# Patient Record
Sex: Male | Born: 1945 | Race: White | Hispanic: No | Marital: Married | Smoking: Former smoker
Health system: Southern US, Community
[De-identification: ages and names within clinical notes are randomized; demographics above are authoritative.]

## PROBLEM LIST (undated history)

## (undated) DIAGNOSIS — I440 Atrioventricular block, first degree: Secondary | ICD-10-CM

## (undated) DIAGNOSIS — L565 Disseminated superficial actinic porokeratosis (DSAP): Secondary | ICD-10-CM

## (undated) DIAGNOSIS — D696 Thrombocytopenia, unspecified: Secondary | ICD-10-CM

## (undated) DIAGNOSIS — R413 Other amnesia: Secondary | ICD-10-CM

## (undated) DIAGNOSIS — I4891 Unspecified atrial fibrillation: Secondary | ICD-10-CM

## (undated) DIAGNOSIS — E785 Hyperlipidemia, unspecified: Secondary | ICD-10-CM

## (undated) DIAGNOSIS — M1990 Unspecified osteoarthritis, unspecified site: Secondary | ICD-10-CM

## (undated) DIAGNOSIS — I499 Cardiac arrhythmia, unspecified: Secondary | ICD-10-CM

## (undated) DIAGNOSIS — N400 Enlarged prostate without lower urinary tract symptoms: Secondary | ICD-10-CM

## (undated) DIAGNOSIS — I10 Essential (primary) hypertension: Secondary | ICD-10-CM

## (undated) DIAGNOSIS — F0390 Unspecified dementia without behavioral disturbance: Secondary | ICD-10-CM

## (undated) DIAGNOSIS — H9190 Unspecified hearing loss, unspecified ear: Secondary | ICD-10-CM

## (undated) DIAGNOSIS — F339 Major depressive disorder, recurrent, unspecified: Secondary | ICD-10-CM

## (undated) DIAGNOSIS — K219 Gastro-esophageal reflux disease without esophagitis: Secondary | ICD-10-CM

## (undated) HISTORY — DX: Other amnesia: R41.3

## (undated) HISTORY — DX: Unspecified dementia, unspecified severity, without behavioral disturbance, psychotic disturbance, mood disturbance, and anxiety: F03.90

## (undated) HISTORY — DX: Gastro-esophageal reflux disease without esophagitis: K21.9

## (undated) HISTORY — DX: Hyperlipidemia, unspecified: E78.5

## (undated) HISTORY — DX: Disseminated superficial actinic porokeratosis (DSAP): L56.5

## (undated) HISTORY — DX: Thrombocytopenia, unspecified: D69.6

## (undated) HISTORY — DX: Unspecified atrial fibrillation: I48.91

## (undated) HISTORY — DX: Atrioventricular block, first degree: I44.0

## (undated) HISTORY — DX: Major depressive disorder, recurrent, unspecified: F33.9

## (undated) HISTORY — PX: CATARACT EXTRACTION, BILATERAL: SHX1313

## (undated) HISTORY — PX: HERNIA REPAIR: SHX51

## (undated) HISTORY — DX: Unspecified osteoarthritis, unspecified site: M19.90

## (undated) HISTORY — PX: OTHER SURGICAL HISTORY: SHX169

---
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
## 1995-01-06 HISTORY — PX: CARDIAC CATHETERIZATION: SHX172

## 1997-10-05 ENCOUNTER — Other Ambulatory Visit: Admission: RE | Admit: 1997-10-05 | Discharge: 1997-10-05 | Payer: Self-pay | Admitting: Urology

## 1999-06-19 ENCOUNTER — Other Ambulatory Visit: Admission: RE | Admit: 1999-06-19 | Discharge: 1999-06-19 | Payer: Self-pay | Admitting: Urology

## 2000-01-16 ENCOUNTER — Other Ambulatory Visit: Admission: RE | Admit: 2000-01-16 | Discharge: 2000-01-16 | Payer: Self-pay | Admitting: Urology

## 2001-04-29 ENCOUNTER — Ambulatory Visit (HOSPITAL_BASED_OUTPATIENT_CLINIC_OR_DEPARTMENT_OTHER): Admission: RE | Admit: 2001-04-29 | Discharge: 2001-04-29 | Payer: Self-pay | Admitting: Urology

## 2011-01-30 DIAGNOSIS — E785 Hyperlipidemia, unspecified: Secondary | ICD-10-CM | POA: Diagnosis not present

## 2011-01-30 DIAGNOSIS — I10 Essential (primary) hypertension: Secondary | ICD-10-CM | POA: Diagnosis not present

## 2011-01-30 DIAGNOSIS — I4891 Unspecified atrial fibrillation: Secondary | ICD-10-CM | POA: Diagnosis not present

## 2011-02-19 DIAGNOSIS — R040 Epistaxis: Secondary | ICD-10-CM | POA: Diagnosis not present

## 2011-02-19 DIAGNOSIS — J3489 Other specified disorders of nose and nasal sinuses: Secondary | ICD-10-CM | POA: Diagnosis not present

## 2011-02-19 DIAGNOSIS — I4892 Unspecified atrial flutter: Secondary | ICD-10-CM | POA: Diagnosis not present

## 2011-06-23 DIAGNOSIS — H2510 Age-related nuclear cataract, unspecified eye: Secondary | ICD-10-CM | POA: Diagnosis not present

## 2011-06-23 DIAGNOSIS — R972 Elevated prostate specific antigen [PSA]: Secondary | ICD-10-CM | POA: Diagnosis not present

## 2011-06-23 DIAGNOSIS — N138 Other obstructive and reflux uropathy: Secondary | ICD-10-CM | POA: Diagnosis not present

## 2011-06-23 DIAGNOSIS — N401 Enlarged prostate with lower urinary tract symptoms: Secondary | ICD-10-CM | POA: Diagnosis not present

## 2011-06-26 DIAGNOSIS — I4891 Unspecified atrial fibrillation: Secondary | ICD-10-CM | POA: Diagnosis not present

## 2011-06-26 DIAGNOSIS — Z6829 Body mass index (BMI) 29.0-29.9, adult: Secondary | ICD-10-CM | POA: Diagnosis not present

## 2011-06-26 DIAGNOSIS — I10 Essential (primary) hypertension: Secondary | ICD-10-CM | POA: Diagnosis not present

## 2011-06-26 DIAGNOSIS — E785 Hyperlipidemia, unspecified: Secondary | ICD-10-CM | POA: Diagnosis not present

## 2011-07-03 DIAGNOSIS — I10 Essential (primary) hypertension: Secondary | ICD-10-CM | POA: Diagnosis not present

## 2011-07-03 DIAGNOSIS — E785 Hyperlipidemia, unspecified: Secondary | ICD-10-CM | POA: Diagnosis not present

## 2011-07-24 DIAGNOSIS — B079 Viral wart, unspecified: Secondary | ICD-10-CM | POA: Diagnosis not present

## 2011-07-24 DIAGNOSIS — L578 Other skin changes due to chronic exposure to nonionizing radiation: Secondary | ICD-10-CM | POA: Diagnosis not present

## 2011-07-24 DIAGNOSIS — L570 Actinic keratosis: Secondary | ICD-10-CM | POA: Diagnosis not present

## 2011-10-09 DIAGNOSIS — I4892 Unspecified atrial flutter: Secondary | ICD-10-CM | POA: Diagnosis not present

## 2011-10-09 DIAGNOSIS — E785 Hyperlipidemia, unspecified: Secondary | ICD-10-CM | POA: Diagnosis not present

## 2011-10-09 DIAGNOSIS — I10 Essential (primary) hypertension: Secondary | ICD-10-CM | POA: Diagnosis not present

## 2011-10-09 DIAGNOSIS — I4891 Unspecified atrial fibrillation: Secondary | ICD-10-CM | POA: Diagnosis not present

## 2011-10-16 DIAGNOSIS — I4891 Unspecified atrial fibrillation: Secondary | ICD-10-CM | POA: Diagnosis not present

## 2011-10-16 DIAGNOSIS — Z23 Encounter for immunization: Secondary | ICD-10-CM | POA: Diagnosis not present

## 2011-10-16 DIAGNOSIS — I10 Essential (primary) hypertension: Secondary | ICD-10-CM | POA: Diagnosis not present

## 2011-12-11 DIAGNOSIS — R972 Elevated prostate specific antigen [PSA]: Secondary | ICD-10-CM | POA: Diagnosis not present

## 2011-12-11 DIAGNOSIS — Z8042 Family history of malignant neoplasm of prostate: Secondary | ICD-10-CM | POA: Diagnosis not present

## 2011-12-18 DIAGNOSIS — N401 Enlarged prostate with lower urinary tract symptoms: Secondary | ICD-10-CM | POA: Diagnosis not present

## 2011-12-18 DIAGNOSIS — Z8042 Family history of malignant neoplasm of prostate: Secondary | ICD-10-CM | POA: Diagnosis not present

## 2011-12-18 DIAGNOSIS — R972 Elevated prostate specific antigen [PSA]: Secondary | ICD-10-CM | POA: Diagnosis not present

## 2012-01-22 DIAGNOSIS — R972 Elevated prostate specific antigen [PSA]: Secondary | ICD-10-CM | POA: Diagnosis not present

## 2012-01-22 DIAGNOSIS — N401 Enlarged prostate with lower urinary tract symptoms: Secondary | ICD-10-CM | POA: Diagnosis not present

## 2012-01-25 ENCOUNTER — Other Ambulatory Visit: Payer: Self-pay | Admitting: Urology

## 2012-01-29 DIAGNOSIS — L570 Actinic keratosis: Secondary | ICD-10-CM | POA: Diagnosis not present

## 2012-02-01 ENCOUNTER — Other Ambulatory Visit: Payer: Self-pay | Admitting: Urology

## 2012-02-17 ENCOUNTER — Encounter (HOSPITAL_COMMUNITY): Payer: Self-pay | Admitting: Pharmacy Technician

## 2012-02-19 ENCOUNTER — Encounter (HOSPITAL_COMMUNITY)
Admission: RE | Admit: 2012-02-19 | Discharge: 2012-02-19 | Disposition: A | Discharge status: Home / Self Care | Payer: Medicare Other | Source: Ambulatory Visit | Attending: Urology | Admitting: Urology

## 2012-02-19 ENCOUNTER — Ambulatory Visit (HOSPITAL_COMMUNITY)
Admission: RE | Admit: 2012-02-19 | Discharge: 2012-02-19 | Disposition: A | Discharge status: Home / Self Care | Payer: Medicare Other | Source: Ambulatory Visit | Attending: Urology | Admitting: Urology

## 2012-02-19 ENCOUNTER — Inpatient Hospital Stay (HOSPITAL_COMMUNITY): Admission: RE | Admit: 2012-02-19 | Payer: Self-pay | Source: Ambulatory Visit

## 2012-02-19 ENCOUNTER — Encounter (HOSPITAL_COMMUNITY): Payer: Self-pay

## 2012-02-19 DIAGNOSIS — I10 Essential (primary) hypertension: Secondary | ICD-10-CM | POA: Diagnosis not present

## 2012-02-19 DIAGNOSIS — Z01818 Encounter for other preprocedural examination: Secondary | ICD-10-CM | POA: Diagnosis not present

## 2012-02-19 DIAGNOSIS — Z01810 Encounter for preprocedural cardiovascular examination: Secondary | ICD-10-CM | POA: Diagnosis not present

## 2012-02-19 DIAGNOSIS — N400 Enlarged prostate without lower urinary tract symptoms: Secondary | ICD-10-CM | POA: Insufficient documentation

## 2012-02-19 DIAGNOSIS — Z01812 Encounter for preprocedural laboratory examination: Secondary | ICD-10-CM | POA: Insufficient documentation

## 2012-02-19 DIAGNOSIS — I2510 Atherosclerotic heart disease of native coronary artery without angina pectoris: Secondary | ICD-10-CM | POA: Insufficient documentation

## 2012-02-19 HISTORY — DX: Essential (primary) hypertension: I10

## 2012-02-19 HISTORY — DX: Unspecified hearing loss, unspecified ear: H91.90

## 2012-02-19 HISTORY — DX: Cardiac arrhythmia, unspecified: I49.9

## 2012-02-19 HISTORY — DX: Unspecified osteoarthritis, unspecified site: M19.90

## 2012-02-19 HISTORY — DX: Benign prostatic hyperplasia without lower urinary tract symptoms: N40.0

## 2012-02-19 HISTORY — DX: Disseminated superficial actinic porokeratosis (DSAP): L56.5

## 2012-02-19 LAB — BASIC METABOLIC PANEL
Calcium: 8.9 mg/dL (ref 8.4–10.5)
Chloride: 104 mEq/L (ref 96–112)
Creatinine, Ser: 1.13 mg/dL (ref 0.50–1.35)
GFR calc Af Amer: 76 mL/min — ABNORMAL LOW (ref >90)
Sodium: 139 mEq/L (ref 135–145)

## 2012-02-19 LAB — CBC
MCH: 34.2 pg — ABNORMAL HIGH (ref 26.0–34.0)
MCV: 99.6 fL (ref 78.0–100.0)
Platelets: 149 10*3/uL — ABNORMAL LOW (ref 150–400)
RDW: 12.3 % (ref 11.5–15.5)
WBC: 6.4 10*3/uL (ref 4.0–10.5)

## 2012-02-19 LAB — PROTIME-INR
INR: 1.15 (ref 0.00–1.49)
Prothrombin Time: 14.5 seconds (ref 11.6–15.2)

## 2012-02-19 LAB — SURGICAL PCR SCREEN: MRSA, PCR: NEGATIVE

## 2012-02-19 NOTE — Pre-Procedure Instructions (Signed)
PREOP CBC, BMET, PT, PTT, EKG, CXR WERE DONE TODAY AT [facility] AS PER ANESTHESIOLOGIST'S GUIDELINES. PT'S MOST RECENT CARDIOLOGY OFFICE NOTES HAVE BEEN REQUESTED FROM DR. ZAN TYSON'S OFFICE.

## 2012-02-19 NOTE — Patient Instructions (Signed)
YOUR SURGERY IS SCHEDULED AT [facility]  ON:  Friday  2/21  REPORT TO [facility]AY [facility] AT:  8:00 AM      PHONE # FOR SHORT STAY IS [phone]  DO NOT EAT OR DRINK ANYTHING AFTER MIDNIGHT THE NIGHT BEFORE YOUR SURGERY.  YOU MAY BRUSH YOUR TEETH, RINSE OUT YOUR MOUTH--BUT NO WATER, NO FOOD, NO CHEWING GUM, NO MINTS, NO CANDIES, NO CHEWING TOBACCO.  PLEASE TAKE THE FOLLOWING MEDICATIONS THE AM OF YOUR SURGERY WITH A FEW SIPS OF WATER:  BISOPROLOL AND FLECANIDE    DO NOT BRING VALUABLES, MONEY, CREDIT CARDS.  DO NOT WEAR JEWELRY, MAKE-UP, NAIL POLISH AND NO METAL PINS OR CLIPS IN YOUR HAIR. CONTACT LENS, DENTURES / PARTIALS, GLASSES SHOULD NOT BE WORN TO SURGERY AND IN MOST CASES-HEARING AIDS WILL NEED TO BE REMOVED.  BRING YOUR GLASSES CASE, ANY EQUIPMENT NEEDED FOR YOUR CONTACT LENS. FOR PATIENTS ADMITTED TO THE HOSPITAL--CHECK OUT TIME THE DAY OF DISCHARGE IS 11:00 AM.  ALL INPATIENT ROOMS ARE PRIVATE - WITH BATHROOM, TELEPHONE, TELEVISION AND WIFI INTERNET.  IF YOU ARE BEING DISCHARGED THE SAME DAY OF YOUR SURGERY--YOU CAN NOT DRIVE YOURSELF HOME--AND SHOULD NOT GO HOME ALONE BY TAXI OR BUS.  NO DRIVING OR OPERATING MACHINERY FOR 24 HOURS FOLLOWING ANESTHESIA / PAIN MEDICATIONS.  PLEASE MAKE ARRANGEMENTS FOR SOMEONE TO BE WITH YOU AT HOME THE FIRST 24 HOURS AFTER SURGERY. RESPONSIBLE DRIVER'S NAME  [facility]                                               PHONE #   [phone]                              PLEASE READ OVER ANY  FACT SHEETS THAT YOU WERE GIVEN: MRSA INFORMATION, BLOOD TRANSFUSION INFORMATION, INCENTIVE SPIROMETER INFORMATION. FAILURE TO FOLLOW THESE INSTRUCTIONS MAY RESULT IN THE CANCELLATION OF YOUR SURGERY.   PATIENT SIGNATURE_________________________________

## 2012-02-22 ENCOUNTER — Encounter (HOSPITAL_COMMUNITY): Payer: Self-pay

## 2012-02-22 NOTE — Pre-Procedure Instructions (Signed)
PT'S MOST RECENT CARDIOLOGY OFFICE NOTE 10/09/11 AND EKG 01/30/11 FROM [facility] - DR. Chales Abrahams - ON THIS CHART.

## 2012-02-22 NOTE — Pre-Procedure Instructions (Signed)
PT'S PT, INR WITHIN NORMAL LIMITS, PTT ELEVATED.  FAXED PT, INR, PTT REPORTS TO DR. TANNENBAUM WITH NOTE TO LET ME KNOW IF HE WANTS PTT REPEATED DAY OF SURGERY.

## 2012-02-26 ENCOUNTER — Encounter (HOSPITAL_COMMUNITY): Payer: Self-pay | Admitting: Anesthesiology

## 2012-02-26 ENCOUNTER — Ambulatory Visit (HOSPITAL_COMMUNITY): Payer: Medicare Other | Admitting: Anesthesiology

## 2012-02-26 ENCOUNTER — Encounter (HOSPITAL_COMMUNITY): Payer: Self-pay | Admitting: *Deleted

## 2012-02-26 ENCOUNTER — Encounter (HOSPITAL_COMMUNITY)
Admission: RE | Disposition: A | Discharge status: Home / Self Care | Payer: Self-pay | Source: Ambulatory Visit | Attending: Urology

## 2012-02-26 ENCOUNTER — Ambulatory Visit (HOSPITAL_COMMUNITY)
Admission: RE | Admit: 2012-02-26 | Discharge: 2012-02-26 | Disposition: A | Discharge status: Home / Self Care | Payer: Medicare Other | Source: Ambulatory Visit | Attending: Urology | Admitting: Urology

## 2012-02-26 DIAGNOSIS — N138 Other obstructive and reflux uropathy: Secondary | ICD-10-CM | POA: Insufficient documentation

## 2012-02-26 DIAGNOSIS — R972 Elevated prostate specific antigen [PSA]: Secondary | ICD-10-CM | POA: Insufficient documentation

## 2012-02-26 DIAGNOSIS — Z802 Family history of malignant neoplasm of other respiratory and intrathoracic organs: Secondary | ICD-10-CM | POA: Diagnosis not present

## 2012-02-26 DIAGNOSIS — Z79899 Other long term (current) drug therapy: Secondary | ICD-10-CM | POA: Insufficient documentation

## 2012-02-26 DIAGNOSIS — N401 Enlarged prostate with lower urinary tract symptoms: Secondary | ICD-10-CM | POA: Insufficient documentation

## 2012-02-26 DIAGNOSIS — I10 Essential (primary) hypertension: Secondary | ICD-10-CM | POA: Diagnosis not present

## 2012-02-26 DIAGNOSIS — N400 Enlarged prostate without lower urinary tract symptoms: Secondary | ICD-10-CM

## 2012-02-26 DIAGNOSIS — I499 Cardiac arrhythmia, unspecified: Secondary | ICD-10-CM | POA: Diagnosis not present

## 2012-02-26 HISTORY — PX: GREEN LIGHT LASER TURP (TRANSURETHRAL RESECTION OF PROSTATE: SHX6260

## 2012-02-26 SURGERY — GREEN LIGHT LASER TURP (TRANSURETHRAL RESECTION OF PROSTATE
Anesthesia: General | Site: Prostate | Wound class: Clean Contaminated

## 2012-02-26 MED ORDER — KETOROLAC TROMETHAMINE 30 MG/ML IJ SOLN
INTRAMUSCULAR | Status: AC
  Filled 2012-02-26: qty 1

## 2012-02-26 MED ORDER — LACTATED RINGERS IV SOLN
INTRAVENOUS | Status: DC
  Administered 2012-02-26 (×3): via INTRAVENOUS

## 2012-02-26 MED ORDER — CEFAZOLIN SODIUM-DEXTROSE 2-3 GM-% IV SOLR
INTRAVENOUS | Status: AC
  Filled 2012-02-26: qty 50

## 2012-02-26 MED ORDER — CHLORHEXIDINE GLUCONATE 4 % EX LIQD
Freq: Once | CUTANEOUS | Status: DC
  Filled 2012-02-26: qty 15

## 2012-02-26 MED ORDER — ACETAMINOPHEN 10 MG/ML IV SOLN
INTRAVENOUS | Status: DC | PRN
  Administered 2012-02-26: 1000 mg via INTRAVENOUS

## 2012-02-26 MED ORDER — FENTANYL CITRATE 0.05 MG/ML IJ SOLN: 25.0000 ug | INTRAMUSCULAR | Status: DC | PRN

## 2012-02-26 MED ORDER — LIDOCAINE HCL 2 % EX GEL
CUTANEOUS | Status: AC
  Filled 2012-02-26: qty 10

## 2012-02-26 MED ORDER — TRAMADOL-ACETAMINOPHEN 37.5-325 MG PO TABS: 1.0000 | ORAL_TABLET | Freq: Four times a day (QID) | ORAL | Status: DC | PRN

## 2012-02-26 MED ORDER — METOCLOPRAMIDE HCL 5 MG/ML IJ SOLN
INTRAMUSCULAR | Status: DC | PRN
  Administered 2012-02-26: 10 mg via INTRAVENOUS

## 2012-02-26 MED ORDER — KETOROLAC TROMETHAMINE 30 MG/ML IJ SOLN
15.0000 mg | Freq: Once | INTRAMUSCULAR | Status: AC | PRN
  Administered 2012-02-26: 30 mg via INTRAVENOUS

## 2012-02-26 MED ORDER — PROMETHAZINE HCL 25 MG/ML IJ SOLN: 6.2500 mg | INTRAMUSCULAR | Status: DC | PRN

## 2012-02-26 MED ORDER — BELLADONNA ALKALOIDS-OPIUM 16.2-60 MG RE SUPP
RECTAL | Status: DC | PRN
  Administered 2012-02-26: 1 via RECTAL

## 2012-02-26 MED ORDER — BELLADONNA ALKALOIDS-OPIUM 16.2-60 MG RE SUPP
RECTAL | Status: AC
  Filled 2012-02-26: qty 1

## 2012-02-26 MED ORDER — CEFAZOLIN SODIUM-DEXTROSE 2-3 GM-% IV SOLR
2.0000 g | INTRAVENOUS | Status: AC
  Administered 2012-02-26: 2 g via INTRAVENOUS

## 2012-02-26 MED ORDER — PROPOFOL 10 MG/ML IV BOLUS
INTRAVENOUS | Status: DC | PRN
  Administered 2012-02-26: 200 mg via INTRAVENOUS

## 2012-02-26 MED ORDER — ACETAMINOPHEN 10 MG/ML IV SOLN
INTRAVENOUS | Status: AC
  Filled 2012-02-26: qty 100

## 2012-02-26 MED ORDER — EPHEDRINE SULFATE 50 MG/ML IJ SOLN
INTRAMUSCULAR | Status: DC | PRN
  Administered 2012-02-26: 10 mg via INTRAVENOUS
  Administered 2012-02-26 (×2): 5 mg via INTRAVENOUS

## 2012-02-26 MED ORDER — URELLE 81 MG PO TABS: 1.0000 | ORAL_TABLET | Freq: Three times a day (TID) | ORAL | Status: DC

## 2012-02-26 MED ORDER — FENTANYL CITRATE 0.05 MG/ML IJ SOLN
INTRAMUSCULAR | Status: DC | PRN
  Administered 2012-02-26: 100 ug via INTRAVENOUS
  Administered 2012-02-26: 50 ug via INTRAVENOUS

## 2012-02-26 MED ORDER — MIDAZOLAM HCL 5 MG/5ML IJ SOLN
INTRAMUSCULAR | Status: DC | PRN
  Administered 2012-02-26: 2 mg via INTRAVENOUS

## 2012-02-26 MED ORDER — SODIUM CHLORIDE 0.9 % IR SOLN
Status: DC | PRN
  Administered 2012-02-26: 9000 mL via INTRAVESICAL

## 2012-02-26 MED ORDER — PHENYLEPHRINE HCL 10 MG/ML IJ SOLN
INTRAMUSCULAR | Status: DC | PRN
  Administered 2012-02-26 (×2): 40 ug via INTRAVENOUS

## 2012-02-26 MED ORDER — TRIMETHOPRIM 100 MG PO TABS: 100.0000 mg | ORAL_TABLET | ORAL | Status: DC

## 2012-02-26 MED ORDER — DEXAMETHASONE SODIUM PHOSPHATE 4 MG/ML IJ SOLN
INTRAMUSCULAR | Status: DC | PRN
  Administered 2012-02-26: 5 mg via INTRAVENOUS

## 2012-02-26 MED ORDER — LIDOCAINE HCL 1 % IJ SOLN
INTRAMUSCULAR | Status: DC | PRN
  Administered 2012-02-26: 50 mg via INTRADERMAL

## 2012-02-26 MED ORDER — ONDANSETRON HCL 4 MG/2ML IJ SOLN
INTRAMUSCULAR | Status: DC | PRN
  Administered 2012-02-26: 4 mg via INTRAVENOUS

## 2012-02-26 SURGICAL SUPPLY — 26 items
BAG URINE DRAINAGE (UROLOGICAL SUPPLIES) ×2 IMPLANT
BAG URO CATCHER STRL LF (DRAPE) ×2 IMPLANT
CATH FOLEY 2WAY SLVR 30CC 22FR (CATHETERS) IMPLANT
CATH HEMA 3WAY 30CC 22FR COUDE (CATHETERS) ×3 IMPLANT
CLOTH BEACON ORANGE TIMEOUT ST (SAFETY) ×2 IMPLANT
DRAPE CAMERA CLOSED 9X96 (DRAPES) ×2 IMPLANT
ELECT BUTTON HF 24-28F 2 30DE (ELECTRODE) IMPLANT
ELECT LOOP MED HF 24F 12D (CUTTING LOOP) IMPLANT
ELECT LOOP MED HF 24F 12D CBL (CLIP) IMPLANT
ELECT RESECT VAPORIZE 12D CBL (ELECTRODE) IMPLANT
FEE RENTAL LASER GREENLIGHT (Laser) ×1 IMPLANT
GLOVE BIOGEL M STRL SZ7.5 (GLOVE) ×2 IMPLANT
GOWN STRL REIN XL XLG (GOWN DISPOSABLE) ×2 IMPLANT
HOLDER FOLEY CATH W/STRAP (MISCELLANEOUS) ×1 IMPLANT
IV NS 1000ML (IV SOLUTION) ×2
IV NS 1000ML BAXH (IV SOLUTION) IMPLANT
IV NS IRRIG 3000ML ARTHROMATIC (IV SOLUTION) ×3 IMPLANT
KIT ASPIRATION TUBING (SET/KITS/TRAYS/PACK) ×2 IMPLANT
LASER FIBER /GREENLIGHT LASER (Laser) ×3 IMPLANT
LASER GREENLIGHT RENTAL P/PROC (Laser) ×4 IMPLANT
MANIFOLD NEPTUNE II (INSTRUMENTS) ×2 IMPLANT
PACK CYSTO (CUSTOM PROCEDURE TRAY) ×2 IMPLANT
PLUG CATH AND CAP STER (CATHETERS) ×2 IMPLANT
SYR 30ML LL (SYRINGE) ×1 IMPLANT
SYRINGE IRR TOOMEY STRL 70CC (SYRINGE) IMPLANT
TUBING CONNECTING 10 (TUBING) ×2 IMPLANT

## 2012-02-26 NOTE — Progress Notes (Signed)
Foley bag changed to leg bag.  Pt and wife instructed on how to empty and position catheter bag as needed for comfort. Pt  Verbalized understanding.

## 2012-02-26 NOTE — H&P (Signed)
Active Problems Problems  1. Benign Prostatic Hypertrophy With Urinary Obstruction 600.01 2. Paternal history of  Prostate Cancer V16.42 3. 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  Hx of BPH with significant BOO & elevated PSA, but with neg biopsies.  He has been treated with Haynes Bast 1/day, but complaining of very poor voiding ability at nighttime.   Nocturia x 4.  Off Coumadin. On ASA 81mg /day.  Note family history of CaP, with RRP, but died 3 years later from Ca Lung. Maternal g-father died 26 2nd CaP. Now taking umeric to try ot lower his PSA. IPSS=13.       Previous Psa 12.50 on 02-22-09. He has been re-biopsied on 5/20/11using new template for recurrent biopsy pts with suspicious psa values (saturation biopsy), and has BPH with some chronic (asymptomatic) inflammation.   12/11/11  PSA - 9.96 06/23/11  PSA - 10.84 12/19/10  PSA - 10.82   Past Medical History Problems  1. History of  Catheter Ablation Of Arrhythmogenic Focus 2. History of  Hypertension 401.9 3. History of  Rhythm Disorder 427.9  Surgical History Problems  1. History of  Cardiovascular Surgery V15.1 2. History of  Inguinal Hernia Repair 3. History of  Umbilical Hernia Repair  Current Meds 1. Bisoprolol Fumarate 5 MG Oral Tablet; Therapy: 17Oct2010 to 2. Cialis 20 MG Oral Tablet; Take as directed; Therapy: 13Jul2009 to (Last Rx:17Oct2011) 3. Flecainide Acetate 100 MG Oral Tablet; Therapy: 29May2012 to 4. Jalyn 0.5-0.4 MG Oral Capsule; TAKE 1 CAPSULE EVERY DAY; Therapy: 27Aug2010 to  (Evaluate:24Dec2012); Last Rx:30Dec2011 5. Lisinopril 10 MG Oral Tablet; Therapy: (Recorded:27Aug2010) to 6. Pradaxa 150 MG Oral Capsule; Therapy: 03Jan2012 to 7. Pravastatin Sodium 40 MG Oral Tablet; Therapy: (Recorded:10Apr2012) to  Requested for:  10Apr2012; Status: ACTIVE - Renewal Denied  Allergies Medication   1. Iodine SOLN  Family History Problems  1. Family history of  Family Health Status Number Of Children 1 son2 daughters 2. Paternal history of  Prostate Cancer V16.42  Social History Problems  1. Alcohol Use 1 a day 2. Caffeine Use 1 3. Family history of  Death In The Family Father 51 4. Family history of  Death In The Family Mother 30 5. Marital History - Currently Married 6. Occupation: Education officer, community 7. History of  Tobacco Use V15.82 1 to 2 packs a day for 4 years  Review of Systems Genitourinary, constitutional, skin, eye, otolaryngeal, hematologic/lymphatic, cardiovascular, pulmonary, endocrine, musculoskeletal, gastrointestinal, neurological and psychiatric system(s) were reviewed and pertinent findings if present are noted.  Genitourinary: urinary frequency, feelings of urinary urgency, nocturia, weak urinary stream, urinary stream starts and stops and incomplete emptying of bladder.    Vitals Vital Signs [Data Includes: Last 1 Day]  17Jan2014 03:07PM  Blood Pressure: 134 / 79 Temperature: 97.5 F Heart Rate: 72  Physical Exam Rectal: Rectal exam demonstrates normal sphincter tone, no tenderness, no masses and no fistula. Estimated prostate size is 3+. Normal rectal tone, no rectal masses, prostate is smooth, symmetric and non-tender. The prostate has no nodularity, is not indurated and is not tender. The left seminal vesicle is nonpalpable. The right seminal vesicle is nonpalpable. The perineum is normal on inspection.  Genitourinary: Examination of the penis demonstrates no discharge, no masses, no lesions and a normal meatus. The scrotum is without lesions. The right epididymis is palpably normal and  non-tender. The left epididymis is palpably normal and non-tender. The right testis is non-tender and without masses. The left testis is non-tender and without masses.    Results/Data  Flow Rate: Voided 328 ml. A peak flow rate of 76ml/s.    Procedure  Procedure:  Cystoscopy   Indication: Lower Urinary Tract Symptoms.  Informed Consent: Risks, benefits, and potential adverse events were discussed and informed consent was obtained from the patient.  Prep: The patient was prepped with betadine.  Anesthesia:. Local anesthesia was administered intraurethrally with 2% lidocaine jelly.  Antibiotic prophylaxis: Ciprofloxacin.  Procedure Note:  Urethral meatus:. No abnormalities.  Anterior urethra: No abnormalities.  Prostatic urethra:. The lateral and median prostatic lobes were enlarged. No intravesical median lobe was visualized.  Bladder: Visulization was clear. The ureteral orifices were in the normal anatomic position bilaterally. Examination of the bladder demonstrated trabeculation, but no clot within the bladder and no diverticulum no fistula, no erythematous mucosa, no ulcer, no edema and no cellules. The patient tolerated the procedure well.  Complications: None.    Assessment Assessed  1. Benign Prostatic Hypertrophy With Urinary Obstruction 600.01 2. Paternal history of  Prostate Cancer V16.42 3. PSA,Elevated 790.93   Chronic bladder outlet obstruction, failing medical therapy. His flow rate is 6 cc per second (normal 25 cc per second). He would like to proceed with green light laser vaporization. Postop foley for 2 days. outpatient procedure.  Schedule green light laser vaporization.   Plan PSA,Elevated (790.93)  1. PVR U/S   Schedule green light laser vaporization.   Signatures Electronically signed by : Jethro Bolus, M.D.; Jan 24 2012  1:52PM

## 2012-02-26 NOTE — Interval H&P Note (Signed)
History and Physical Interval Note:  02/26/2012 11:35 AM  Mitchell Miles  has presented today for surgery, with the diagnosis of Benign Prostatic Hypertrophy  The various methods of treatment have been discussed with the patient and family. After consideration of risks, benefits and other options for treatment, the patient has consented to  Procedure(s): GREEN LIGHT LASER TURP (TRANSURETHRAL RESECTION OF PROSTATE (N/A) as a surgical intervention .  The patient's history has been reviewed, patient examined, no change in status, stable for surgery.  I have reviewed the patient's chart and labs.  Questions were answered to the patient's satisfaction.     Jethro Bolus I

## 2012-02-26 NOTE — Op Note (Signed)
Pre-operative diagnosis :    BPH  Postoperative diagnosis:  Same   Operation:   Cystourethroscopy, greenlight laser vaporization of prostate  Surgeon:  S. Patsi Sears, MD  First assistant:   None  Anesthesia:  general  Preparation:   After appropriate preanesthesia, the patient was brought to the operating room, placed on the operating table in the dorsal supine position where general LMA anesthesia was introduced. He was then replaced in the dorsal lithotomy position with pubis was prepped with Betadine solution and draped in usual fashion. The arm band was double checked.  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 Hx of BPH with significant BOO & elevated PSA, but with neg biopsies. He has been treated with Haynes Bast 1/day, but complaining of very poor voiding ability at nighttime. Nocturia x 4. Off Coumadin. On ASA 81mg /day. Note family history of CaP, with RRP, but died 3 years later from Ca Lung. Maternal g-father died 57 2nd CaP. Now taking umeric to try ot lower his PSA. IPSS=13.  Previous Psa 12.50 on 02-22-09. He has been re-biopsied on 5/20/11using new template for recurrent biopsy pts with suspicious psa values (saturation biopsy), and has BPH with some chronic (asymptomatic) inflammation.      Statement of  Likelihood of Success: Excellent. TIME-OUT observed.:  Procedure:   Cystourethroscopy was accomplished, and showed normal pendulous urethra. The vera was intact during the entire case. The patient had very large lateral lobes, extending across the midline bilaterally. He had an elevated bladder neck. There was no bladder stone tumor or diverticular formation. The trigone was normal, and clear reflux was noted from both orifices.  Laser vaporization was accomplished and from the 7:00 position, to the Vero, and from the 5:00 position to the Vero. The lateral lobes were then vaporized using 80 Y. power at the  bladder neck, and 120 W power on the lateral lobes. Following laser vaporization, the laser was removed. Bleeding was electrocoagulated with the laser room. Following this, a size 22 hematuria catheter was placed with 30 cc in the balloon. The urine was clear route, but the traction device was placed anyway for possible traction at a later time. If necessary a. The patient was given a B. and O. suppository, it was awakened and taken to recovery room in good condition.

## 2012-02-26 NOTE — Anesthesia Preprocedure Evaluation (Signed)
Anesthesia Evaluation  Patient identified by MRN, date of birth, ID band Patient awake    Reviewed: Allergy & Precautions, H&P , NPO status , Patient's Chart, lab work & pertinent test results  Airway Mallampati: II TM Distance: >3 FB Neck ROM: Full    Dental no notable dental hx.    Pulmonary neg pulmonary ROS,  breath sounds clear to auscultation  Pulmonary exam normal       Cardiovascular hypertension, Pt. on medications + dysrhythmias Rhythm:Regular Rate:Normal  H/O afib ablation x 2   Neuro/Psych negative neurological ROS  negative psych ROS   GI/Hepatic negative GI ROS, Neg liver ROS,   Endo/Other  negative endocrine ROS  Renal/GU negative Renal ROS  negative genitourinary   Musculoskeletal negative musculoskeletal ROS (+)   Abdominal   Peds negative pediatric ROS (+)  Hematology negative hematology ROS (+)   Anesthesia Other Findings   Reproductive/Obstetrics negative OB ROS                           Anesthesia Physical Anesthesia Plan  ASA: II  Anesthesia Plan: General   Post-op Pain Management:    Induction: Intravenous  Airway Management Planned: LMA  Additional Equipment:   Intra-op Plan:   Post-operative Plan:   Informed Consent: I have reviewed the patients History and Physical, chart, labs and discussed the procedure including the risks, benefits and alternatives for the proposed anesthesia with the patient or authorized representative who has indicated his/her understanding and acceptance.   Dental advisory given  Plan Discussed with: CRNA and Surgeon  Anesthesia Plan Comments:         Anesthesia Quick Evaluation

## 2012-02-26 NOTE — Transfer of Care (Signed)
Immediate Anesthesia Transfer of Care Note  Patient: Mitchell Miles  Procedure(s) Performed: Procedure(s): GREEN LIGHT LASER TURP (TRANSURETHRAL RESECTION OF PROSTATE (N/A)  Patient Location: PACU  Anesthesia Type:General  Level of Consciousness: awake, alert , oriented and patient cooperative  Airway & Oxygen Therapy: Patient Spontanous Breathing and Patient connected to face mask oxygen  Post-op Assessment: Report given to PACU RN and Post -op Vital signs reviewed and stable  Post vital signs: Reviewed and stable  Complications: No apparent anesthesia complications

## 2012-02-26 NOTE — Anesthesia Postprocedure Evaluation (Signed)
  Anesthesia Post-op Note  Patient: Mitchell Miles  Procedure(s) Performed: Procedure(s) (LRB): GREEN LIGHT LASER TURP (TRANSURETHRAL RESECTION OF PROSTATE (N/A)  Patient Location: PACU  Anesthesia Type: General  Level of Consciousness: awake and alert   Airway and Oxygen Therapy: Patient Spontanous Breathing  Post-op Pain: mild  Post-op Assessment: Post-op Vital signs reviewed, Patient's Cardiovascular Status Stable, Respiratory Function Stable, Patent Airway and No signs of Nausea or vomiting  Last Vitals:  Filed Vitals:   02/26/12 1154  BP: 131/87  Pulse: 67  Temp: 36.5 C  Resp: 16    Post-op Vital Signs: stable   Complications: No apparent anesthesia complications

## 2012-02-29 ENCOUNTER — Encounter (HOSPITAL_COMMUNITY): Payer: Self-pay | Admitting: Urology

## 2012-03-25 DIAGNOSIS — L570 Actinic keratosis: Secondary | ICD-10-CM | POA: Diagnosis not present

## 2012-04-15 DIAGNOSIS — E785 Hyperlipidemia, unspecified: Secondary | ICD-10-CM | POA: Diagnosis not present

## 2012-04-15 DIAGNOSIS — N401 Enlarged prostate with lower urinary tract symptoms: Secondary | ICD-10-CM | POA: Diagnosis not present

## 2012-04-15 DIAGNOSIS — I10 Essential (primary) hypertension: Secondary | ICD-10-CM | POA: Diagnosis not present

## 2012-04-15 DIAGNOSIS — Z0000 Encounter for general adult medical examination without abnormal findings: Secondary | ICD-10-CM | POA: Diagnosis not present

## 2012-04-15 DIAGNOSIS — Z6829 Body mass index (BMI) 29.0-29.9, adult: Secondary | ICD-10-CM | POA: Diagnosis not present

## 2012-05-20 DIAGNOSIS — N401 Enlarged prostate with lower urinary tract symptoms: Secondary | ICD-10-CM | POA: Diagnosis not present

## 2012-05-20 DIAGNOSIS — N3020 Other chronic cystitis without hematuria: Secondary | ICD-10-CM | POA: Diagnosis not present

## 2012-06-17 DIAGNOSIS — H179 Unspecified corneal scar and opacity: Secondary | ICD-10-CM | POA: Diagnosis not present

## 2012-06-24 DIAGNOSIS — L570 Actinic keratosis: Secondary | ICD-10-CM | POA: Diagnosis not present

## 2012-07-22 DIAGNOSIS — B079 Viral wart, unspecified: Secondary | ICD-10-CM | POA: Diagnosis not present

## 2012-09-06 DIAGNOSIS — I4891 Unspecified atrial fibrillation: Secondary | ICD-10-CM | POA: Diagnosis not present

## 2012-09-06 DIAGNOSIS — I4892 Unspecified atrial flutter: Secondary | ICD-10-CM | POA: Diagnosis not present

## 2012-09-06 DIAGNOSIS — I10 Essential (primary) hypertension: Secondary | ICD-10-CM | POA: Diagnosis not present

## 2012-09-06 DIAGNOSIS — R0609 Other forms of dyspnea: Secondary | ICD-10-CM | POA: Diagnosis not present

## 2012-09-06 DIAGNOSIS — Z7901 Long term (current) use of anticoagulants: Secondary | ICD-10-CM | POA: Diagnosis not present

## 2012-09-06 DIAGNOSIS — E785 Hyperlipidemia, unspecified: Secondary | ICD-10-CM | POA: Diagnosis not present

## 2012-09-23 DIAGNOSIS — I4891 Unspecified atrial fibrillation: Secondary | ICD-10-CM | POA: Diagnosis not present

## 2012-09-23 DIAGNOSIS — R0609 Other forms of dyspnea: Secondary | ICD-10-CM | POA: Diagnosis not present

## 2012-09-30 DIAGNOSIS — L565 Disseminated superficial actinic porokeratosis (DSAP): Secondary | ICD-10-CM | POA: Diagnosis not present

## 2012-09-30 DIAGNOSIS — L570 Actinic keratosis: Secondary | ICD-10-CM | POA: Diagnosis not present

## 2012-09-30 DIAGNOSIS — L821 Other seborrheic keratosis: Secondary | ICD-10-CM | POA: Diagnosis not present

## 2012-09-30 DIAGNOSIS — L578 Other skin changes due to chronic exposure to nonionizing radiation: Secondary | ICD-10-CM | POA: Diagnosis not present

## 2012-09-30 DIAGNOSIS — D485 Neoplasm of uncertain behavior of skin: Secondary | ICD-10-CM | POA: Diagnosis not present

## 2012-10-10 DIAGNOSIS — Z23 Encounter for immunization: Secondary | ICD-10-CM | POA: Diagnosis not present

## 2012-10-14 DIAGNOSIS — I440 Atrioventricular block, first degree: Secondary | ICD-10-CM | POA: Diagnosis not present

## 2012-10-14 DIAGNOSIS — E785 Hyperlipidemia, unspecified: Secondary | ICD-10-CM | POA: Diagnosis not present

## 2012-10-14 DIAGNOSIS — K219 Gastro-esophageal reflux disease without esophagitis: Secondary | ICD-10-CM | POA: Diagnosis not present

## 2012-10-14 DIAGNOSIS — I10 Essential (primary) hypertension: Secondary | ICD-10-CM | POA: Diagnosis not present

## 2012-11-11 DIAGNOSIS — L708 Other acne: Secondary | ICD-10-CM | POA: Diagnosis not present

## 2012-11-25 DIAGNOSIS — L708 Other acne: Secondary | ICD-10-CM | POA: Diagnosis not present

## 2013-01-27 DIAGNOSIS — L570 Actinic keratosis: Secondary | ICD-10-CM | POA: Diagnosis not present

## 2013-01-27 DIAGNOSIS — L578 Other skin changes due to chronic exposure to nonionizing radiation: Secondary | ICD-10-CM | POA: Diagnosis not present

## 2013-02-10 DIAGNOSIS — H903 Sensorineural hearing loss, bilateral: Secondary | ICD-10-CM | POA: Diagnosis not present

## 2013-02-24 DIAGNOSIS — M1610 Unilateral primary osteoarthritis, unspecified hip: Secondary | ICD-10-CM | POA: Diagnosis not present

## 2013-02-24 DIAGNOSIS — Z6830 Body mass index (BMI) 30.0-30.9, adult: Secondary | ICD-10-CM | POA: Diagnosis not present

## 2013-02-24 DIAGNOSIS — M25559 Pain in unspecified hip: Secondary | ICD-10-CM | POA: Diagnosis not present

## 2013-02-24 DIAGNOSIS — M169 Osteoarthritis of hip, unspecified: Secondary | ICD-10-CM | POA: Diagnosis not present

## 2013-03-10 DIAGNOSIS — M461 Sacroiliitis, not elsewhere classified: Secondary | ICD-10-CM | POA: Diagnosis not present

## 2013-03-10 DIAGNOSIS — M1610 Unilateral primary osteoarthritis, unspecified hip: Secondary | ICD-10-CM | POA: Diagnosis not present

## 2013-03-31 DIAGNOSIS — M1610 Unilateral primary osteoarthritis, unspecified hip: Secondary | ICD-10-CM | POA: Diagnosis not present

## 2013-03-31 DIAGNOSIS — M461 Sacroiliitis, not elsewhere classified: Secondary | ICD-10-CM | POA: Diagnosis not present

## 2013-05-07 DIAGNOSIS — R5381 Other malaise: Secondary | ICD-10-CM | POA: Diagnosis not present

## 2013-05-07 DIAGNOSIS — R509 Fever, unspecified: Secondary | ICD-10-CM | POA: Diagnosis not present

## 2013-05-07 DIAGNOSIS — J019 Acute sinusitis, unspecified: Secondary | ICD-10-CM | POA: Diagnosis not present

## 2013-05-07 DIAGNOSIS — R05 Cough: Secondary | ICD-10-CM | POA: Diagnosis not present

## 2013-05-07 DIAGNOSIS — R0989 Other specified symptoms and signs involving the circulatory and respiratory systems: Secondary | ICD-10-CM | POA: Diagnosis not present

## 2013-05-07 DIAGNOSIS — R059 Cough, unspecified: Secondary | ICD-10-CM | POA: Diagnosis not present

## 2013-05-07 DIAGNOSIS — R5383 Other fatigue: Secondary | ICD-10-CM | POA: Diagnosis not present

## 2013-05-07 DIAGNOSIS — J209 Acute bronchitis, unspecified: Secondary | ICD-10-CM | POA: Diagnosis not present

## 2013-05-22 DIAGNOSIS — R972 Elevated prostate specific antigen [PSA]: Secondary | ICD-10-CM | POA: Diagnosis not present

## 2013-05-22 DIAGNOSIS — N138 Other obstructive and reflux uropathy: Secondary | ICD-10-CM | POA: Diagnosis not present

## 2013-05-22 DIAGNOSIS — N401 Enlarged prostate with lower urinary tract symptoms: Secondary | ICD-10-CM | POA: Diagnosis not present

## 2013-05-22 DIAGNOSIS — Z8042 Family history of malignant neoplasm of prostate: Secondary | ICD-10-CM | POA: Diagnosis not present

## 2013-05-22 DIAGNOSIS — N139 Obstructive and reflux uropathy, unspecified: Secondary | ICD-10-CM | POA: Diagnosis not present

## 2013-05-26 DIAGNOSIS — R972 Elevated prostate specific antigen [PSA]: Secondary | ICD-10-CM | POA: Diagnosis not present

## 2013-05-26 DIAGNOSIS — N139 Obstructive and reflux uropathy, unspecified: Secondary | ICD-10-CM | POA: Diagnosis not present

## 2013-05-26 DIAGNOSIS — N401 Enlarged prostate with lower urinary tract symptoms: Secondary | ICD-10-CM | POA: Diagnosis not present

## 2013-06-23 DIAGNOSIS — B079 Viral wart, unspecified: Secondary | ICD-10-CM | POA: Diagnosis not present

## 2013-07-21 DIAGNOSIS — H35419 Lattice degeneration of retina, unspecified eye: Secondary | ICD-10-CM | POA: Diagnosis not present

## 2013-07-21 DIAGNOSIS — Z9889 Other specified postprocedural states: Secondary | ICD-10-CM

## 2013-07-21 DIAGNOSIS — H35412 Lattice degeneration of retina, left eye: Secondary | ICD-10-CM

## 2013-07-21 DIAGNOSIS — H2510 Age-related nuclear cataract, unspecified eye: Secondary | ICD-10-CM

## 2013-07-21 HISTORY — DX: Other specified postprocedural states: Z98.890

## 2013-07-21 HISTORY — DX: Age-related nuclear cataract, unspecified eye: H25.10

## 2013-07-21 HISTORY — DX: Lattice degeneration of retina, left eye: H35.412

## 2013-08-25 DIAGNOSIS — H2510 Age-related nuclear cataract, unspecified eye: Secondary | ICD-10-CM | POA: Diagnosis not present

## 2013-09-18 DIAGNOSIS — Z7901 Long term (current) use of anticoagulants: Secondary | ICD-10-CM | POA: Diagnosis not present

## 2013-09-18 DIAGNOSIS — Z87891 Personal history of nicotine dependence: Secondary | ICD-10-CM | POA: Diagnosis not present

## 2013-09-18 DIAGNOSIS — Z98811 Dental restoration status: Secondary | ICD-10-CM | POA: Diagnosis not present

## 2013-09-18 DIAGNOSIS — Z79899 Other long term (current) drug therapy: Secondary | ICD-10-CM | POA: Diagnosis not present

## 2013-09-18 DIAGNOSIS — H2589 Other age-related cataract: Secondary | ICD-10-CM | POA: Diagnosis not present

## 2013-09-18 DIAGNOSIS — J309 Allergic rhinitis, unspecified: Secondary | ICD-10-CM | POA: Diagnosis not present

## 2013-09-18 DIAGNOSIS — H2510 Age-related nuclear cataract, unspecified eye: Secondary | ICD-10-CM | POA: Diagnosis not present

## 2013-09-18 DIAGNOSIS — Z9109 Other allergy status, other than to drugs and biological substances: Secondary | ICD-10-CM | POA: Diagnosis not present

## 2013-09-18 DIAGNOSIS — Z9861 Coronary angioplasty status: Secondary | ICD-10-CM | POA: Diagnosis not present

## 2013-09-18 DIAGNOSIS — I10 Essential (primary) hypertension: Secondary | ICD-10-CM | POA: Diagnosis not present

## 2013-09-19 DIAGNOSIS — Z9849 Cataract extraction status, unspecified eye: Secondary | ICD-10-CM | POA: Insufficient documentation

## 2013-09-19 HISTORY — DX: Cataract extraction status, unspecified eye: Z98.49

## 2013-09-25 DIAGNOSIS — Z23 Encounter for immunization: Secondary | ICD-10-CM | POA: Diagnosis not present

## 2013-09-28 DIAGNOSIS — H2589 Other age-related cataract: Secondary | ICD-10-CM | POA: Diagnosis not present

## 2013-10-13 DIAGNOSIS — I10 Essential (primary) hypertension: Secondary | ICD-10-CM | POA: Diagnosis not present

## 2013-10-13 DIAGNOSIS — E785 Hyperlipidemia, unspecified: Secondary | ICD-10-CM | POA: Diagnosis not present

## 2013-10-13 DIAGNOSIS — K219 Gastro-esophageal reflux disease without esophagitis: Secondary | ICD-10-CM | POA: Diagnosis not present

## 2013-10-13 DIAGNOSIS — Z79899 Other long term (current) drug therapy: Secondary | ICD-10-CM | POA: Diagnosis not present

## 2013-10-13 DIAGNOSIS — I4891 Unspecified atrial fibrillation: Secondary | ICD-10-CM | POA: Diagnosis not present

## 2013-10-13 DIAGNOSIS — J302 Other seasonal allergic rhinitis: Secondary | ICD-10-CM | POA: Diagnosis not present

## 2013-10-13 DIAGNOSIS — I440 Atrioventricular block, first degree: Secondary | ICD-10-CM | POA: Diagnosis not present

## 2013-10-13 DIAGNOSIS — Z23 Encounter for immunization: Secondary | ICD-10-CM | POA: Diagnosis not present

## 2013-10-27 DIAGNOSIS — L600 Ingrowing nail: Secondary | ICD-10-CM | POA: Diagnosis not present

## 2013-10-29 DIAGNOSIS — H2512 Age-related nuclear cataract, left eye: Secondary | ICD-10-CM | POA: Insufficient documentation

## 2013-10-29 HISTORY — DX: Age-related nuclear cataract, left eye: H25.12

## 2013-10-30 DIAGNOSIS — E669 Obesity, unspecified: Secondary | ICD-10-CM | POA: Diagnosis not present

## 2013-10-30 DIAGNOSIS — H2512 Age-related nuclear cataract, left eye: Secondary | ICD-10-CM | POA: Diagnosis not present

## 2013-10-30 DIAGNOSIS — H25812 Combined forms of age-related cataract, left eye: Secondary | ICD-10-CM | POA: Diagnosis not present

## 2013-10-30 DIAGNOSIS — Z79899 Other long term (current) drug therapy: Secondary | ICD-10-CM | POA: Diagnosis not present

## 2013-10-30 DIAGNOSIS — Z87891 Personal history of nicotine dependence: Secondary | ICD-10-CM | POA: Diagnosis not present

## 2013-10-30 DIAGNOSIS — I10 Essential (primary) hypertension: Secondary | ICD-10-CM | POA: Diagnosis not present

## 2013-10-31 DIAGNOSIS — H25812 Combined forms of age-related cataract, left eye: Secondary | ICD-10-CM | POA: Diagnosis not present

## 2013-12-08 DIAGNOSIS — J019 Acute sinusitis, unspecified: Secondary | ICD-10-CM | POA: Diagnosis not present

## 2013-12-08 DIAGNOSIS — J309 Allergic rhinitis, unspecified: Secondary | ICD-10-CM | POA: Diagnosis not present

## 2014-01-01 DIAGNOSIS — Z6832 Body mass index (BMI) 32.0-32.9, adult: Secondary | ICD-10-CM | POA: Diagnosis not present

## 2014-01-01 DIAGNOSIS — J019 Acute sinusitis, unspecified: Secondary | ICD-10-CM | POA: Diagnosis not present

## 2014-01-01 DIAGNOSIS — J209 Acute bronchitis, unspecified: Secondary | ICD-10-CM | POA: Diagnosis not present

## 2014-01-19 DIAGNOSIS — B079 Viral wart, unspecified: Secondary | ICD-10-CM | POA: Diagnosis not present

## 2014-01-19 DIAGNOSIS — L570 Actinic keratosis: Secondary | ICD-10-CM | POA: Diagnosis not present

## 2014-01-19 DIAGNOSIS — L821 Other seborrheic keratosis: Secondary | ICD-10-CM | POA: Diagnosis not present

## 2014-02-07 DIAGNOSIS — Z800 Family history of malignant neoplasm of digestive organs: Secondary | ICD-10-CM | POA: Diagnosis not present

## 2014-02-07 DIAGNOSIS — Z86010 Personal history of colonic polyps: Secondary | ICD-10-CM | POA: Diagnosis not present

## 2014-02-07 DIAGNOSIS — K219 Gastro-esophageal reflux disease without esophagitis: Secondary | ICD-10-CM | POA: Diagnosis not present

## 2014-02-23 DIAGNOSIS — Z800 Family history of malignant neoplasm of digestive organs: Secondary | ICD-10-CM | POA: Diagnosis not present

## 2014-02-23 DIAGNOSIS — K5730 Diverticulosis of large intestine without perforation or abscess without bleeding: Secondary | ICD-10-CM | POA: Diagnosis not present

## 2014-02-23 DIAGNOSIS — I440 Atrioventricular block, first degree: Secondary | ICD-10-CM | POA: Diagnosis not present

## 2014-03-16 DIAGNOSIS — K5730 Diverticulosis of large intestine without perforation or abscess without bleeding: Secondary | ICD-10-CM | POA: Diagnosis not present

## 2014-03-16 DIAGNOSIS — Z800 Family history of malignant neoplasm of digestive organs: Secondary | ICD-10-CM | POA: Diagnosis not present

## 2014-04-20 DIAGNOSIS — Z125 Encounter for screening for malignant neoplasm of prostate: Secondary | ICD-10-CM | POA: Diagnosis not present

## 2014-04-20 DIAGNOSIS — Z6831 Body mass index (BMI) 31.0-31.9, adult: Secondary | ICD-10-CM | POA: Diagnosis not present

## 2014-04-20 DIAGNOSIS — I440 Atrioventricular block, first degree: Secondary | ICD-10-CM | POA: Diagnosis not present

## 2014-04-20 DIAGNOSIS — K219 Gastro-esophageal reflux disease without esophagitis: Secondary | ICD-10-CM | POA: Diagnosis not present

## 2014-04-20 DIAGNOSIS — I10 Essential (primary) hypertension: Secondary | ICD-10-CM | POA: Diagnosis not present

## 2014-04-20 DIAGNOSIS — Z79899 Other long term (current) drug therapy: Secondary | ICD-10-CM | POA: Diagnosis not present

## 2014-04-20 DIAGNOSIS — I4891 Unspecified atrial fibrillation: Secondary | ICD-10-CM | POA: Diagnosis not present

## 2014-04-20 DIAGNOSIS — E785 Hyperlipidemia, unspecified: Secondary | ICD-10-CM | POA: Diagnosis not present

## 2014-04-27 DIAGNOSIS — L570 Actinic keratosis: Secondary | ICD-10-CM | POA: Diagnosis not present

## 2014-04-27 DIAGNOSIS — B079 Viral wart, unspecified: Secondary | ICD-10-CM | POA: Diagnosis not present

## 2014-05-25 DIAGNOSIS — N401 Enlarged prostate with lower urinary tract symptoms: Secondary | ICD-10-CM | POA: Diagnosis not present

## 2014-06-01 DIAGNOSIS — R312 Other microscopic hematuria: Secondary | ICD-10-CM | POA: Diagnosis not present

## 2014-06-01 DIAGNOSIS — R972 Elevated prostate specific antigen [PSA]: Secondary | ICD-10-CM | POA: Diagnosis not present

## 2014-06-22 DIAGNOSIS — R312 Other microscopic hematuria: Secondary | ICD-10-CM | POA: Diagnosis not present

## 2014-06-22 DIAGNOSIS — K5730 Diverticulosis of large intestine without perforation or abscess without bleeding: Secondary | ICD-10-CM | POA: Diagnosis not present

## 2014-06-22 DIAGNOSIS — N400 Enlarged prostate without lower urinary tract symptoms: Secondary | ICD-10-CM | POA: Diagnosis not present

## 2014-07-04 DIAGNOSIS — N138 Other obstructive and reflux uropathy: Secondary | ICD-10-CM | POA: Diagnosis not present

## 2014-07-04 DIAGNOSIS — N401 Enlarged prostate with lower urinary tract symptoms: Secondary | ICD-10-CM | POA: Diagnosis not present

## 2014-07-04 DIAGNOSIS — R312 Other microscopic hematuria: Secondary | ICD-10-CM | POA: Diagnosis not present

## 2014-07-04 DIAGNOSIS — N362 Urethral caruncle: Secondary | ICD-10-CM | POA: Diagnosis not present

## 2014-08-17 DIAGNOSIS — L821 Other seborrheic keratosis: Secondary | ICD-10-CM | POA: Diagnosis not present

## 2014-08-17 DIAGNOSIS — L570 Actinic keratosis: Secondary | ICD-10-CM | POA: Diagnosis not present

## 2014-08-17 DIAGNOSIS — L578 Other skin changes due to chronic exposure to nonionizing radiation: Secondary | ICD-10-CM | POA: Diagnosis not present

## 2014-10-12 DIAGNOSIS — Z961 Presence of intraocular lens: Secondary | ICD-10-CM | POA: Diagnosis not present

## 2014-10-19 DIAGNOSIS — K219 Gastro-esophageal reflux disease without esophagitis: Secondary | ICD-10-CM | POA: Diagnosis not present

## 2014-10-19 DIAGNOSIS — Z23 Encounter for immunization: Secondary | ICD-10-CM | POA: Diagnosis not present

## 2014-10-19 DIAGNOSIS — I10 Essential (primary) hypertension: Secondary | ICD-10-CM | POA: Diagnosis not present

## 2014-10-19 DIAGNOSIS — J019 Acute sinusitis, unspecified: Secondary | ICD-10-CM | POA: Diagnosis not present

## 2014-10-19 DIAGNOSIS — Z79899 Other long term (current) drug therapy: Secondary | ICD-10-CM | POA: Diagnosis not present

## 2014-10-19 DIAGNOSIS — I4891 Unspecified atrial fibrillation: Secondary | ICD-10-CM | POA: Diagnosis not present

## 2014-10-19 DIAGNOSIS — I440 Atrioventricular block, first degree: Secondary | ICD-10-CM | POA: Diagnosis not present

## 2014-10-19 DIAGNOSIS — F0390 Unspecified dementia without behavioral disturbance: Secondary | ICD-10-CM | POA: Diagnosis not present

## 2014-10-19 DIAGNOSIS — E785 Hyperlipidemia, unspecified: Secondary | ICD-10-CM | POA: Diagnosis not present

## 2014-10-19 DIAGNOSIS — Z6831 Body mass index (BMI) 31.0-31.9, adult: Secondary | ICD-10-CM | POA: Diagnosis not present

## 2014-11-13 DIAGNOSIS — H1132 Conjunctival hemorrhage, left eye: Secondary | ICD-10-CM | POA: Diagnosis not present

## 2014-11-23 DIAGNOSIS — L570 Actinic keratosis: Secondary | ICD-10-CM | POA: Diagnosis not present

## 2014-11-23 DIAGNOSIS — L821 Other seborrheic keratosis: Secondary | ICD-10-CM | POA: Diagnosis not present

## 2014-11-23 DIAGNOSIS — L578 Other skin changes due to chronic exposure to nonionizing radiation: Secondary | ICD-10-CM | POA: Diagnosis not present

## 2014-12-21 DIAGNOSIS — R972 Elevated prostate specific antigen [PSA]: Secondary | ICD-10-CM | POA: Diagnosis not present

## 2014-12-21 DIAGNOSIS — R351 Nocturia: Secondary | ICD-10-CM | POA: Diagnosis not present

## 2014-12-21 DIAGNOSIS — N401 Enlarged prostate with lower urinary tract symptoms: Secondary | ICD-10-CM | POA: Diagnosis not present

## 2014-12-24 IMAGING — CR DG CHEST 2V
2 series · 2 of 2 positions shown · non-contrast
Comparison: PA and lateral chest 12/23/2007.

CLINICAL DATA: Preoperative respiratory films.

CHEST - 2 VIEW

[w chest pa]
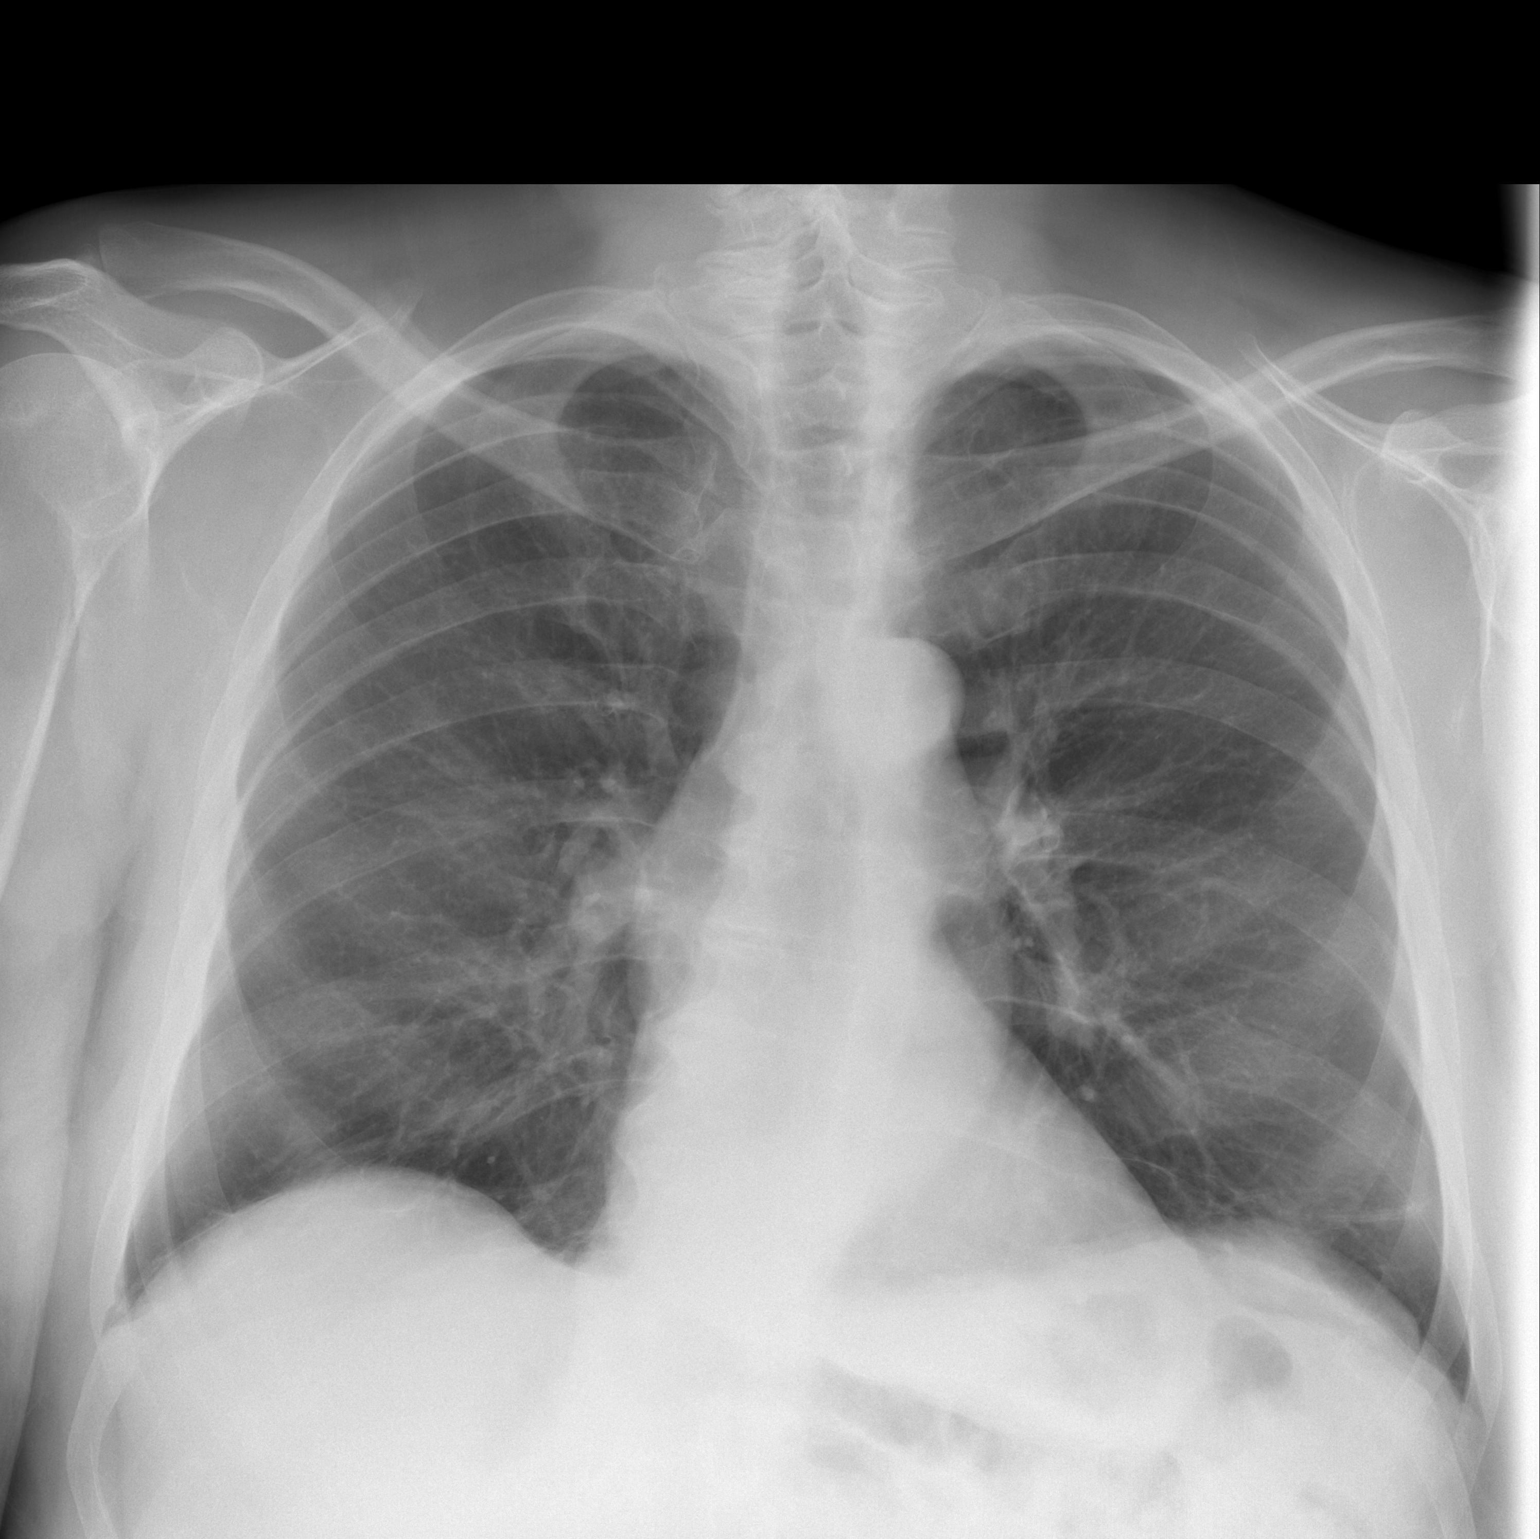

[w chest lat]
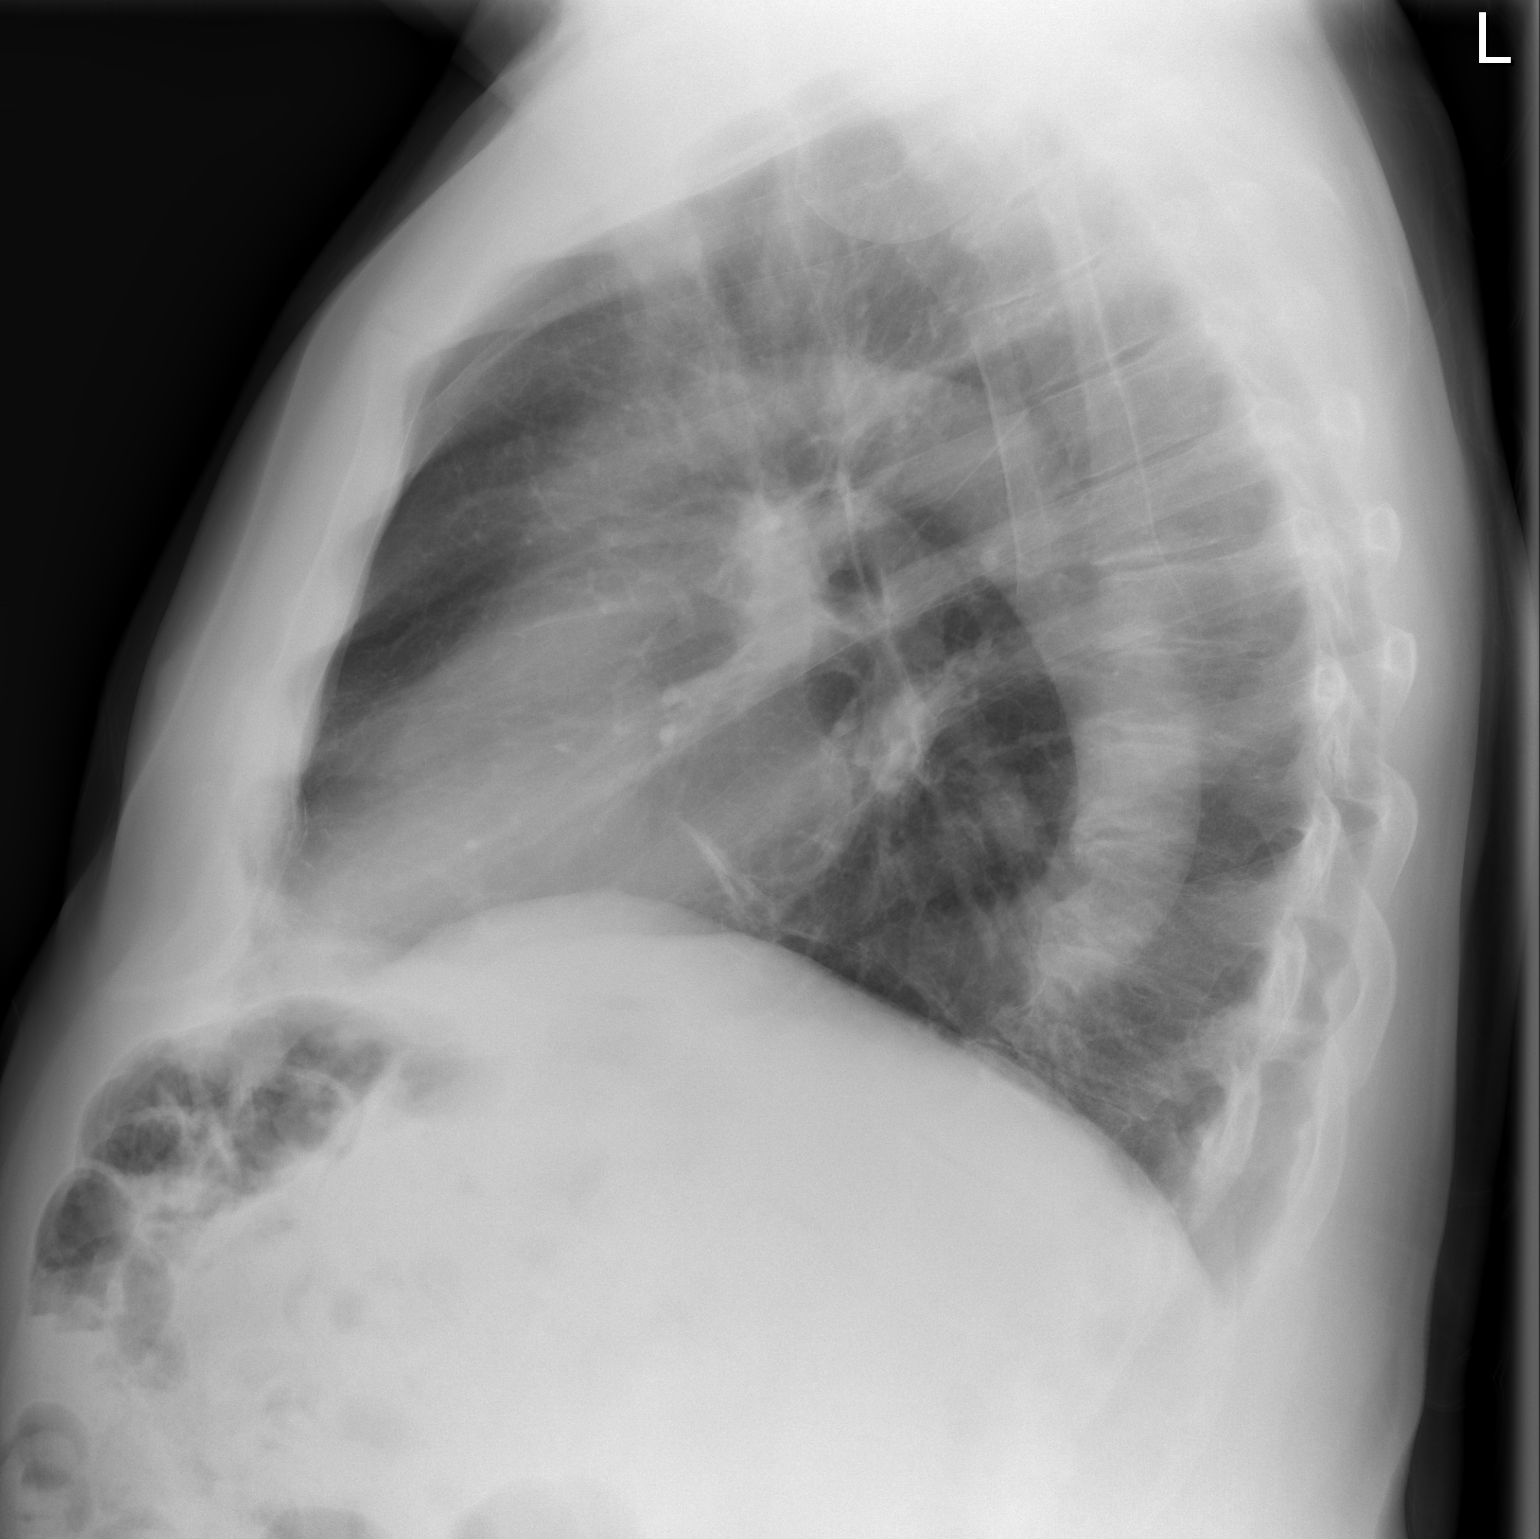

[2 of 2 positions shown; findings below may reference images not displayed]

FINDINGS: Minimal linear atelectasis or scarring is seen in the
left lung base.  Lungs otherwise clear.  Heart size is normal.  No
pneumothorax or pleural fluid.
IMPRESSION: No acute disease.

## 2015-01-17 DIAGNOSIS — J9811 Atelectasis: Secondary | ICD-10-CM | POA: Diagnosis not present

## 2015-01-17 DIAGNOSIS — Z6833 Body mass index (BMI) 33.0-33.9, adult: Secondary | ICD-10-CM | POA: Diagnosis not present

## 2015-01-17 DIAGNOSIS — E669 Obesity, unspecified: Secondary | ICD-10-CM | POA: Diagnosis not present

## 2015-01-17 DIAGNOSIS — R0781 Pleurodynia: Secondary | ICD-10-CM | POA: Diagnosis not present

## 2015-01-17 DIAGNOSIS — W19XXXA Unspecified fall, initial encounter: Secondary | ICD-10-CM | POA: Diagnosis not present

## 2015-03-08 DIAGNOSIS — L84 Corns and callosities: Secondary | ICD-10-CM | POA: Insufficient documentation

## 2015-03-08 HISTORY — DX: Corns and callosities: L84

## 2015-04-05 DIAGNOSIS — L578 Other skin changes due to chronic exposure to nonionizing radiation: Secondary | ICD-10-CM | POA: Diagnosis not present

## 2015-04-05 DIAGNOSIS — L570 Actinic keratosis: Secondary | ICD-10-CM | POA: Diagnosis not present

## 2015-04-26 DIAGNOSIS — I10 Essential (primary) hypertension: Secondary | ICD-10-CM | POA: Diagnosis not present

## 2015-04-26 DIAGNOSIS — Z125 Encounter for screening for malignant neoplasm of prostate: Secondary | ICD-10-CM | POA: Diagnosis not present

## 2015-04-26 DIAGNOSIS — I4891 Unspecified atrial fibrillation: Secondary | ICD-10-CM | POA: Diagnosis not present

## 2015-04-26 DIAGNOSIS — E785 Hyperlipidemia, unspecified: Secondary | ICD-10-CM | POA: Diagnosis not present

## 2015-04-26 DIAGNOSIS — Z6832 Body mass index (BMI) 32.0-32.9, adult: Secondary | ICD-10-CM | POA: Diagnosis not present

## 2015-04-26 DIAGNOSIS — Z79899 Other long term (current) drug therapy: Secondary | ICD-10-CM | POA: Diagnosis not present

## 2015-05-17 DIAGNOSIS — M25551 Pain in right hip: Secondary | ICD-10-CM | POA: Diagnosis not present

## 2015-05-17 DIAGNOSIS — M6281 Muscle weakness (generalized): Secondary | ICD-10-CM | POA: Diagnosis not present

## 2015-05-17 DIAGNOSIS — Z9181 History of falling: Secondary | ICD-10-CM | POA: Diagnosis not present

## 2015-05-20 DIAGNOSIS — Z9181 History of falling: Secondary | ICD-10-CM | POA: Diagnosis not present

## 2015-05-20 DIAGNOSIS — M6281 Muscle weakness (generalized): Secondary | ICD-10-CM | POA: Diagnosis not present

## 2015-05-20 DIAGNOSIS — M25551 Pain in right hip: Secondary | ICD-10-CM | POA: Diagnosis not present

## 2015-05-27 DIAGNOSIS — Z9181 History of falling: Secondary | ICD-10-CM | POA: Diagnosis not present

## 2015-05-27 DIAGNOSIS — M6281 Muscle weakness (generalized): Secondary | ICD-10-CM | POA: Diagnosis not present

## 2015-05-27 DIAGNOSIS — M25551 Pain in right hip: Secondary | ICD-10-CM | POA: Diagnosis not present

## 2015-05-31 DIAGNOSIS — Z9181 History of falling: Secondary | ICD-10-CM | POA: Diagnosis not present

## 2015-05-31 DIAGNOSIS — M25551 Pain in right hip: Secondary | ICD-10-CM | POA: Diagnosis not present

## 2015-05-31 DIAGNOSIS — M6281 Muscle weakness (generalized): Secondary | ICD-10-CM | POA: Diagnosis not present

## 2015-06-03 DIAGNOSIS — M25551 Pain in right hip: Secondary | ICD-10-CM | POA: Diagnosis not present

## 2015-06-03 DIAGNOSIS — M6281 Muscle weakness (generalized): Secondary | ICD-10-CM | POA: Diagnosis not present

## 2015-06-03 DIAGNOSIS — Z9181 History of falling: Secondary | ICD-10-CM | POA: Diagnosis not present

## 2015-06-05 DIAGNOSIS — Z9181 History of falling: Secondary | ICD-10-CM | POA: Diagnosis not present

## 2015-06-05 DIAGNOSIS — M25551 Pain in right hip: Secondary | ICD-10-CM | POA: Diagnosis not present

## 2015-06-05 DIAGNOSIS — M6281 Muscle weakness (generalized): Secondary | ICD-10-CM | POA: Diagnosis not present

## 2015-06-10 DIAGNOSIS — Z9181 History of falling: Secondary | ICD-10-CM | POA: Diagnosis not present

## 2015-06-10 DIAGNOSIS — M6281 Muscle weakness (generalized): Secondary | ICD-10-CM | POA: Diagnosis not present

## 2015-06-10 DIAGNOSIS — M25551 Pain in right hip: Secondary | ICD-10-CM | POA: Diagnosis not present

## 2015-06-12 DIAGNOSIS — M25551 Pain in right hip: Secondary | ICD-10-CM | POA: Diagnosis not present

## 2015-06-12 DIAGNOSIS — Z9181 History of falling: Secondary | ICD-10-CM | POA: Diagnosis not present

## 2015-06-12 DIAGNOSIS — M6281 Muscle weakness (generalized): Secondary | ICD-10-CM | POA: Diagnosis not present

## 2015-06-18 DIAGNOSIS — Z9181 History of falling: Secondary | ICD-10-CM | POA: Diagnosis not present

## 2015-06-18 DIAGNOSIS — M25551 Pain in right hip: Secondary | ICD-10-CM | POA: Diagnosis not present

## 2015-06-18 DIAGNOSIS — M6281 Muscle weakness (generalized): Secondary | ICD-10-CM | POA: Diagnosis not present

## 2015-06-21 DIAGNOSIS — Z9181 History of falling: Secondary | ICD-10-CM | POA: Diagnosis not present

## 2015-06-21 DIAGNOSIS — M6281 Muscle weakness (generalized): Secondary | ICD-10-CM | POA: Diagnosis not present

## 2015-06-21 DIAGNOSIS — M25551 Pain in right hip: Secondary | ICD-10-CM | POA: Diagnosis not present

## 2015-07-01 DIAGNOSIS — M25551 Pain in right hip: Secondary | ICD-10-CM | POA: Diagnosis not present

## 2015-07-01 DIAGNOSIS — Z9181 History of falling: Secondary | ICD-10-CM | POA: Diagnosis not present

## 2015-07-01 DIAGNOSIS — M6281 Muscle weakness (generalized): Secondary | ICD-10-CM | POA: Diagnosis not present

## 2015-07-04 DIAGNOSIS — M25551 Pain in right hip: Secondary | ICD-10-CM | POA: Diagnosis not present

## 2015-07-04 DIAGNOSIS — Z9181 History of falling: Secondary | ICD-10-CM | POA: Diagnosis not present

## 2015-07-04 DIAGNOSIS — M6281 Muscle weakness (generalized): Secondary | ICD-10-CM | POA: Diagnosis not present

## 2015-07-08 DIAGNOSIS — Z9181 History of falling: Secondary | ICD-10-CM | POA: Diagnosis not present

## 2015-07-08 DIAGNOSIS — M6281 Muscle weakness (generalized): Secondary | ICD-10-CM | POA: Diagnosis not present

## 2015-07-08 DIAGNOSIS — M25551 Pain in right hip: Secondary | ICD-10-CM | POA: Diagnosis not present

## 2015-07-15 DIAGNOSIS — Z9181 History of falling: Secondary | ICD-10-CM | POA: Diagnosis not present

## 2015-07-15 DIAGNOSIS — M25551 Pain in right hip: Secondary | ICD-10-CM | POA: Diagnosis not present

## 2015-07-15 DIAGNOSIS — M6281 Muscle weakness (generalized): Secondary | ICD-10-CM | POA: Diagnosis not present

## 2015-07-19 DIAGNOSIS — M6281 Muscle weakness (generalized): Secondary | ICD-10-CM | POA: Diagnosis not present

## 2015-07-19 DIAGNOSIS — M25551 Pain in right hip: Secondary | ICD-10-CM | POA: Diagnosis not present

## 2015-07-19 DIAGNOSIS — Z9181 History of falling: Secondary | ICD-10-CM | POA: Diagnosis not present

## 2015-07-22 DIAGNOSIS — M6281 Muscle weakness (generalized): Secondary | ICD-10-CM | POA: Diagnosis not present

## 2015-07-22 DIAGNOSIS — Z9181 History of falling: Secondary | ICD-10-CM | POA: Diagnosis not present

## 2015-07-22 DIAGNOSIS — M25551 Pain in right hip: Secondary | ICD-10-CM | POA: Diagnosis not present

## 2015-07-24 DIAGNOSIS — M6281 Muscle weakness (generalized): Secondary | ICD-10-CM | POA: Diagnosis not present

## 2015-07-24 DIAGNOSIS — M25551 Pain in right hip: Secondary | ICD-10-CM | POA: Diagnosis not present

## 2015-07-24 DIAGNOSIS — Z9181 History of falling: Secondary | ICD-10-CM | POA: Diagnosis not present

## 2015-08-12 DIAGNOSIS — Z9181 History of falling: Secondary | ICD-10-CM | POA: Diagnosis not present

## 2015-08-12 DIAGNOSIS — M25551 Pain in right hip: Secondary | ICD-10-CM | POA: Diagnosis not present

## 2015-08-12 DIAGNOSIS — M6281 Muscle weakness (generalized): Secondary | ICD-10-CM | POA: Diagnosis not present

## 2015-10-11 DIAGNOSIS — J302 Other seasonal allergic rhinitis: Secondary | ICD-10-CM | POA: Diagnosis not present

## 2015-10-11 DIAGNOSIS — I400 Infective myocarditis: Secondary | ICD-10-CM | POA: Diagnosis not present

## 2015-10-11 DIAGNOSIS — Z79899 Other long term (current) drug therapy: Secondary | ICD-10-CM | POA: Diagnosis not present

## 2015-10-11 DIAGNOSIS — Z23 Encounter for immunization: Secondary | ICD-10-CM | POA: Diagnosis not present

## 2015-10-11 DIAGNOSIS — I4891 Unspecified atrial fibrillation: Secondary | ICD-10-CM | POA: Diagnosis not present

## 2015-10-11 DIAGNOSIS — I10 Essential (primary) hypertension: Secondary | ICD-10-CM | POA: Diagnosis not present

## 2015-10-11 DIAGNOSIS — F0390 Unspecified dementia without behavioral disturbance: Secondary | ICD-10-CM | POA: Diagnosis not present

## 2015-10-11 DIAGNOSIS — K219 Gastro-esophageal reflux disease without esophagitis: Secondary | ICD-10-CM | POA: Diagnosis not present

## 2015-10-11 DIAGNOSIS — E785 Hyperlipidemia, unspecified: Secondary | ICD-10-CM | POA: Diagnosis not present

## 2015-10-11 DIAGNOSIS — Z6831 Body mass index (BMI) 31.0-31.9, adult: Secondary | ICD-10-CM | POA: Diagnosis not present

## 2015-11-08 DIAGNOSIS — L820 Inflamed seborrheic keratosis: Secondary | ICD-10-CM | POA: Diagnosis not present

## 2015-11-08 DIAGNOSIS — L570 Actinic keratosis: Secondary | ICD-10-CM | POA: Diagnosis not present

## 2015-11-08 DIAGNOSIS — L578 Other skin changes due to chronic exposure to nonionizing radiation: Secondary | ICD-10-CM | POA: Diagnosis not present

## 2015-12-06 DIAGNOSIS — H18892 Other specified disorders of cornea, left eye: Secondary | ICD-10-CM | POA: Diagnosis not present

## 2015-12-06 DIAGNOSIS — Z961 Presence of intraocular lens: Secondary | ICD-10-CM | POA: Diagnosis not present

## 2016-01-31 DIAGNOSIS — R972 Elevated prostate specific antigen [PSA]: Secondary | ICD-10-CM | POA: Diagnosis not present

## 2016-02-07 DIAGNOSIS — N401 Enlarged prostate with lower urinary tract symptoms: Secondary | ICD-10-CM | POA: Diagnosis not present

## 2016-02-07 DIAGNOSIS — Z136 Encounter for screening for cardiovascular disorders: Secondary | ICD-10-CM | POA: Diagnosis not present

## 2016-02-07 DIAGNOSIS — E669 Obesity, unspecified: Secondary | ICD-10-CM | POA: Diagnosis not present

## 2016-02-07 DIAGNOSIS — Z0000 Encounter for general adult medical examination without abnormal findings: Secondary | ICD-10-CM | POA: Diagnosis not present

## 2016-02-07 DIAGNOSIS — R972 Elevated prostate specific antigen [PSA]: Secondary | ICD-10-CM | POA: Diagnosis not present

## 2016-02-07 DIAGNOSIS — Z125 Encounter for screening for malignant neoplasm of prostate: Secondary | ICD-10-CM | POA: Diagnosis not present

## 2016-02-07 DIAGNOSIS — R351 Nocturia: Secondary | ICD-10-CM | POA: Diagnosis not present

## 2016-02-07 DIAGNOSIS — L578 Other skin changes due to chronic exposure to nonionizing radiation: Secondary | ICD-10-CM | POA: Diagnosis not present

## 2016-02-07 DIAGNOSIS — Z1389 Encounter for screening for other disorder: Secondary | ICD-10-CM | POA: Diagnosis not present

## 2016-02-07 DIAGNOSIS — Z9181 History of falling: Secondary | ICD-10-CM | POA: Diagnosis not present

## 2016-02-07 DIAGNOSIS — L570 Actinic keratosis: Secondary | ICD-10-CM | POA: Diagnosis not present

## 2016-04-06 DIAGNOSIS — M545 Low back pain: Secondary | ICD-10-CM | POA: Diagnosis not present

## 2016-04-15 DIAGNOSIS — I4891 Unspecified atrial fibrillation: Secondary | ICD-10-CM | POA: Diagnosis not present

## 2016-04-15 DIAGNOSIS — I10 Essential (primary) hypertension: Secondary | ICD-10-CM | POA: Diagnosis not present

## 2016-04-15 DIAGNOSIS — I440 Atrioventricular block, first degree: Secondary | ICD-10-CM | POA: Diagnosis not present

## 2016-04-15 DIAGNOSIS — E785 Hyperlipidemia, unspecified: Secondary | ICD-10-CM | POA: Diagnosis not present

## 2016-04-15 DIAGNOSIS — F0390 Unspecified dementia without behavioral disturbance: Secondary | ICD-10-CM | POA: Diagnosis not present

## 2016-04-15 DIAGNOSIS — Z6832 Body mass index (BMI) 32.0-32.9, adult: Secondary | ICD-10-CM | POA: Diagnosis not present

## 2016-04-15 DIAGNOSIS — M5432 Sciatica, left side: Secondary | ICD-10-CM | POA: Diagnosis not present

## 2016-04-15 DIAGNOSIS — Z79899 Other long term (current) drug therapy: Secondary | ICD-10-CM | POA: Diagnosis not present

## 2016-04-15 DIAGNOSIS — K219 Gastro-esophageal reflux disease without esophagitis: Secondary | ICD-10-CM | POA: Diagnosis not present

## 2016-04-28 DIAGNOSIS — Z6832 Body mass index (BMI) 32.0-32.9, adult: Secondary | ICD-10-CM | POA: Diagnosis not present

## 2016-04-28 DIAGNOSIS — M5416 Radiculopathy, lumbar region: Secondary | ICD-10-CM | POA: Diagnosis not present

## 2016-04-30 DIAGNOSIS — Z6831 Body mass index (BMI) 31.0-31.9, adult: Secondary | ICD-10-CM | POA: Diagnosis not present

## 2016-04-30 DIAGNOSIS — M5416 Radiculopathy, lumbar region: Secondary | ICD-10-CM | POA: Diagnosis not present

## 2016-05-07 DIAGNOSIS — M545 Low back pain: Secondary | ICD-10-CM | POA: Diagnosis not present

## 2016-05-07 DIAGNOSIS — M48061 Spinal stenosis, lumbar region without neurogenic claudication: Secondary | ICD-10-CM | POA: Diagnosis not present

## 2016-05-07 DIAGNOSIS — M5126 Other intervertebral disc displacement, lumbar region: Secondary | ICD-10-CM | POA: Diagnosis not present

## 2016-05-15 DIAGNOSIS — M4696 Unspecified inflammatory spondylopathy, lumbar region: Secondary | ICD-10-CM | POA: Diagnosis not present

## 2016-05-15 DIAGNOSIS — Z6831 Body mass index (BMI) 31.0-31.9, adult: Secondary | ICD-10-CM | POA: Diagnosis not present

## 2016-05-15 DIAGNOSIS — M5416 Radiculopathy, lumbar region: Secondary | ICD-10-CM | POA: Diagnosis not present

## 2016-07-25 ENCOUNTER — Encounter: Payer: Self-pay | Admitting: Neurology

## 2016-07-25 DIAGNOSIS — M7989 Other specified soft tissue disorders: Secondary | ICD-10-CM | POA: Diagnosis not present

## 2016-07-25 DIAGNOSIS — R0902 Hypoxemia: Secondary | ICD-10-CM | POA: Diagnosis not present

## 2016-07-25 DIAGNOSIS — L03114 Cellulitis of left upper limb: Secondary | ICD-10-CM | POA: Diagnosis not present

## 2016-07-25 DIAGNOSIS — R410 Disorientation, unspecified: Secondary | ICD-10-CM | POA: Diagnosis not present

## 2016-07-25 DIAGNOSIS — G92 Toxic encephalopathy: Secondary | ICD-10-CM | POA: Diagnosis not present

## 2016-07-25 DIAGNOSIS — R4182 Altered mental status, unspecified: Secondary | ICD-10-CM | POA: Diagnosis not present

## 2016-07-25 DIAGNOSIS — S0990XA Unspecified injury of head, initial encounter: Secondary | ICD-10-CM | POA: Diagnosis not present

## 2016-07-25 DIAGNOSIS — I10 Essential (primary) hypertension: Secondary | ICD-10-CM | POA: Diagnosis not present

## 2016-07-25 DIAGNOSIS — R069 Unspecified abnormalities of breathing: Secondary | ICD-10-CM | POA: Diagnosis not present

## 2016-07-25 DIAGNOSIS — R5381 Other malaise: Secondary | ICD-10-CM | POA: Diagnosis not present

## 2016-07-25 DIAGNOSIS — R0602 Shortness of breath: Secondary | ICD-10-CM | POA: Diagnosis not present

## 2016-07-25 DIAGNOSIS — R2681 Unsteadiness on feet: Secondary | ICD-10-CM | POA: Diagnosis not present

## 2016-07-25 DIAGNOSIS — Z79899 Other long term (current) drug therapy: Secondary | ICD-10-CM | POA: Diagnosis not present

## 2016-07-25 DIAGNOSIS — I4891 Unspecified atrial fibrillation: Secondary | ICD-10-CM | POA: Diagnosis not present

## 2016-07-29 DIAGNOSIS — R32 Unspecified urinary incontinence: Secondary | ICD-10-CM | POA: Diagnosis not present

## 2016-07-29 DIAGNOSIS — F329 Major depressive disorder, single episode, unspecified: Secondary | ICD-10-CM | POA: Diagnosis not present

## 2016-07-29 DIAGNOSIS — L03114 Cellulitis of left upper limb: Secondary | ICD-10-CM | POA: Diagnosis not present

## 2016-07-29 DIAGNOSIS — Z7951 Long term (current) use of inhaled steroids: Secondary | ICD-10-CM | POA: Diagnosis not present

## 2016-07-29 DIAGNOSIS — M1991 Primary osteoarthritis, unspecified site: Secondary | ICD-10-CM | POA: Diagnosis not present

## 2016-07-29 DIAGNOSIS — I4891 Unspecified atrial fibrillation: Secondary | ICD-10-CM | POA: Diagnosis not present

## 2016-07-29 DIAGNOSIS — I10 Essential (primary) hypertension: Secondary | ICD-10-CM | POA: Diagnosis not present

## 2016-07-29 DIAGNOSIS — Z792 Long term (current) use of antibiotics: Secondary | ICD-10-CM | POA: Diagnosis not present

## 2016-07-29 DIAGNOSIS — Z9181 History of falling: Secondary | ICD-10-CM | POA: Diagnosis not present

## 2016-07-29 DIAGNOSIS — Z7901 Long term (current) use of anticoagulants: Secondary | ICD-10-CM | POA: Diagnosis not present

## 2016-07-29 DIAGNOSIS — F0391 Unspecified dementia with behavioral disturbance: Secondary | ICD-10-CM | POA: Diagnosis not present

## 2016-07-30 DIAGNOSIS — I10 Essential (primary) hypertension: Secondary | ICD-10-CM | POA: Diagnosis not present

## 2016-07-30 DIAGNOSIS — M1991 Primary osteoarthritis, unspecified site: Secondary | ICD-10-CM | POA: Diagnosis not present

## 2016-07-30 DIAGNOSIS — Z09 Encounter for follow-up examination after completed treatment for conditions other than malignant neoplasm: Secondary | ICD-10-CM | POA: Diagnosis not present

## 2016-07-30 DIAGNOSIS — I4891 Unspecified atrial fibrillation: Secondary | ICD-10-CM | POA: Diagnosis not present

## 2016-07-30 DIAGNOSIS — L03114 Cellulitis of left upper limb: Secondary | ICD-10-CM | POA: Diagnosis not present

## 2016-07-30 DIAGNOSIS — Z79899 Other long term (current) drug therapy: Secondary | ICD-10-CM | POA: Diagnosis not present

## 2016-07-30 DIAGNOSIS — L03119 Cellulitis of unspecified part of limb: Secondary | ICD-10-CM | POA: Diagnosis not present

## 2016-07-30 DIAGNOSIS — Z6832 Body mass index (BMI) 32.0-32.9, adult: Secondary | ICD-10-CM | POA: Diagnosis not present

## 2016-07-30 DIAGNOSIS — F0391 Unspecified dementia with behavioral disturbance: Secondary | ICD-10-CM | POA: Diagnosis not present

## 2016-07-30 DIAGNOSIS — F329 Major depressive disorder, single episode, unspecified: Secondary | ICD-10-CM | POA: Diagnosis not present

## 2016-07-30 DIAGNOSIS — F0390 Unspecified dementia without behavioral disturbance: Secondary | ICD-10-CM | POA: Diagnosis not present

## 2016-07-31 DIAGNOSIS — F0391 Unspecified dementia with behavioral disturbance: Secondary | ICD-10-CM | POA: Diagnosis not present

## 2016-07-31 DIAGNOSIS — L03114 Cellulitis of left upper limb: Secondary | ICD-10-CM | POA: Diagnosis not present

## 2016-07-31 DIAGNOSIS — M1991 Primary osteoarthritis, unspecified site: Secondary | ICD-10-CM | POA: Diagnosis not present

## 2016-07-31 DIAGNOSIS — F329 Major depressive disorder, single episode, unspecified: Secondary | ICD-10-CM | POA: Diagnosis not present

## 2016-07-31 DIAGNOSIS — I10 Essential (primary) hypertension: Secondary | ICD-10-CM | POA: Diagnosis not present

## 2016-07-31 DIAGNOSIS — I4891 Unspecified atrial fibrillation: Secondary | ICD-10-CM | POA: Diagnosis not present

## 2016-08-03 DIAGNOSIS — F329 Major depressive disorder, single episode, unspecified: Secondary | ICD-10-CM | POA: Diagnosis not present

## 2016-08-03 DIAGNOSIS — L03114 Cellulitis of left upper limb: Secondary | ICD-10-CM | POA: Diagnosis not present

## 2016-08-03 DIAGNOSIS — F0391 Unspecified dementia with behavioral disturbance: Secondary | ICD-10-CM | POA: Diagnosis not present

## 2016-08-03 DIAGNOSIS — M1991 Primary osteoarthritis, unspecified site: Secondary | ICD-10-CM | POA: Diagnosis not present

## 2016-08-03 DIAGNOSIS — I10 Essential (primary) hypertension: Secondary | ICD-10-CM | POA: Diagnosis not present

## 2016-08-03 DIAGNOSIS — I4891 Unspecified atrial fibrillation: Secondary | ICD-10-CM | POA: Diagnosis not present

## 2016-08-06 DIAGNOSIS — I10 Essential (primary) hypertension: Secondary | ICD-10-CM | POA: Diagnosis not present

## 2016-08-06 DIAGNOSIS — M1991 Primary osteoarthritis, unspecified site: Secondary | ICD-10-CM | POA: Diagnosis not present

## 2016-08-06 DIAGNOSIS — F329 Major depressive disorder, single episode, unspecified: Secondary | ICD-10-CM | POA: Diagnosis not present

## 2016-08-06 DIAGNOSIS — F0391 Unspecified dementia with behavioral disturbance: Secondary | ICD-10-CM | POA: Diagnosis not present

## 2016-08-06 DIAGNOSIS — L03114 Cellulitis of left upper limb: Secondary | ICD-10-CM | POA: Diagnosis not present

## 2016-08-06 DIAGNOSIS — I4891 Unspecified atrial fibrillation: Secondary | ICD-10-CM | POA: Diagnosis not present

## 2016-09-03 ENCOUNTER — Encounter: Payer: Self-pay | Admitting: Neurology

## 2016-09-03 ENCOUNTER — Telehealth: Payer: Self-pay | Admitting: Neurology

## 2016-09-03 ENCOUNTER — Ambulatory Visit (INDEPENDENT_AMBULATORY_CARE_PROVIDER_SITE_OTHER): Payer: Medicare Other | Admitting: Neurology

## 2016-09-03 VITALS — BP 142/96 | HR 77 | Ht 71.0 in | Wt 238.0 lb

## 2016-09-03 DIAGNOSIS — G3184 Mild cognitive impairment, so stated: Secondary | ICD-10-CM

## 2016-09-03 DIAGNOSIS — F0390 Unspecified dementia without behavioral disturbance: Secondary | ICD-10-CM

## 2016-09-03 HISTORY — DX: Mild cognitive impairment of uncertain or unknown etiology: G31.84

## 2016-09-03 MED ORDER — MEMANTINE HCL 10 MG PO TABS: 10.0000 mg | ORAL_TABLET | Freq: Two times a day (BID) | ORAL | 4 refills | Status: DC

## 2016-09-03 MED ORDER — DONEPEZIL HCL 10 MG PO TABS: 10.0000 mg | ORAL_TABLET | Freq: Every day | ORAL | 11 refills | Status: DC

## 2016-09-03 NOTE — Patient Instructions (Signed)
Get his most recent hospital record  MRI of brain CD  Laboratory evaluations

## 2016-09-03 NOTE — Telephone Encounter (Signed)
Mitchell Miles/[facility] [phone] said there is a drug interaction with donepezil (ARICEPT) 10 MG tablet and flecainide (TAMBOCOR) 100 MG tablet (prescribed by Dr Megan Salon) can cause prolonged QTC interval. He is needing a call back to dispense the medication

## 2016-09-03 NOTE — Telephone Encounter (Signed)
Pt wife calling back re: Dr Krista Blue wanting medical records from [facility], they need a written request for the records from Dr Tylene Fantasia note can be faxed to [phone]  Attention [facility].. Since Dr Krista Blue wants a cd of the MRI of pt's brain please state that in the note as well.  Pt wife only asking for a call if there are questions

## 2016-09-03 NOTE — Progress Notes (Signed)
PATIENT: Mitchell Miles DOB: 1945/01/13  Chief Complaint  Patient presents with  . Memory Loss    MMSE - animals.  Dr. 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  He is currently taking Namenda 10mg , BID.  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  In 2015, he was noted to have gradually onset memory loss, also personality change, he has lost his professional interest, forgot appointment, forget to document, symptoms gradually become more frequent more obvious, especially since his most recent left arm cellulitis in July 2017 which require hospital admission, he was noted to have increased confusion, he was also noted to feel frustrated easily, become sedentary.  During his hospital admission in July 2018, he had MRI of  brain, laboratory evaluations,  He was started on Namenda 10 mg twice a day since 2017, initially noticed mild improvement.  His paternal grandfather suffered Alzheimer's disease, his father has Parkinson's disease  REVIEW OF SYSTEMS: Full 14 system review of systems performed and notable only for Fatigue, swelling legs, and the air, cough, snoring, allergies, memory loss, confusion, sleepiness, snoring, decreased energy, disinteresting activities.  ALLERGIES: Allergies  Allergen Reactions  . Other Other (See Comments)    Rhinitis  . Betadine [Povidone Iodine] Rash    HOME MEDICATIONS: Current Outpatient Prescriptions  Medication Sig Dispense Refill  . Ascorbic Acid (VITAMIN C) 1000 MG tablet Take 1,000 mg by mouth daily.    Marland Kitchen aspirin EC 81 MG tablet Take 81 mg by mouth every evening.    . bisoprolol (ZEBETA) 5 MG tablet Take  5 mg by mouth daily before breakfast.    . Cholecalciferol (VITAMIN D) 2000 UNITS CAPS Take 1 capsule by mouth daily.    . dabigatran (PRADAXA) 150 MG CAPS Take 150 mg by mouth every 12 (twelve) hours.    . Dutasteride-Tamsulosin HCl (JALYN) 0.5-0.4 MG CAPS Take 1 capsule by mouth every evening.    . flecainide (TAMBOCOR) 100 MG tablet Take 100 mg by mouth 2 (two) times daily.    Nyoka Cowden Tea, Camillia sinensis, 315 MG CAPS Take 1 capsule by mouth daily.    Marland Kitchen lisinopril (PRINIVIL,ZESTRIL) 10 MG tablet Take 10 mg by mouth daily before breakfast.    . Lycopene 10 MG CAPS Take 2 capsules by mouth daily.    . Magnesium 250 MG TABS Take 1 tablet by mouth daily.    . Multiple Vitamin (MULTIVITAMIN WITH MINERALS) TABS Take 1 tablet by mouth daily.    . Omega-3 Fatty Acids (FISH OIL) 1200 MG CAPS Take 1 capsule by mouth daily.    Marland Kitchen OVER THE COUNTER MEDICATION Take 1 capsule by mouth 2 (two) times daily. Tumeric 500 mg    . OVER THE COUNTER MEDICATION Take 1 tablet by mouth daily. Pygeum 1000 mg    . pravastatin (PRAVACHOL) 40 MG tablet Take 40 mg by mouth every evening.    . tadalafil (CIALIS) 20 MG tablet Take 20 mg by mouth daily as needed for erectile dysfunction.    . traMADol-acetaminophen (ULTRACET) 37.5-325 MG per tablet Take 1 tablet by mouth every 6 (six) hours as needed for pain.  30 tablet 2  . trimethoprim (TRIMPEX) 100 MG tablet Take 1 tablet (100 mg total) by mouth 1 day or 1 dose. 30 tablet 1  . URELLE (URELLE/URISED) 81 MG TABS Take 1 tablet (81 mg total) by mouth 3 (three) times daily. 30 each 2   No current facility-administered medications for this visit.     PAST MEDICAL HISTORY: Past Medical History:  Diagnosis Date  . Arthritis   . Atrial fibrillation ([place])   . AV block, 1st degree   . BPH (benign prostatic hypertrophy)    URINARY FREQUENCY AND NOCTURIA  . Dermatitis, disseminated superficial actinic porokeratosis (DSAP)   . DSAP (disseminated superficial actinic  porokeratosis)   . Dysrhythmia    PAST HX OF ATRIAL FIB / FLUTTER-CARDIAC ABLATIONS X 2 AND ON CHRONIC PRADAXA, FLECAINIDE.  CARDIOLGIST IS DR. ZAN TYSON [facility]   . GERD (gastroesophageal reflux disease)   . Hyperlipidemia   . Hypertension   . Memory loss   . Osteoarthritis   . Problems with hearing    HAS HEARING AIDS    PAST SURGICAL HISTORY: Past Surgical History:  Procedure Laterality Date  . CARDIAC ABLATIONS X2     2008 AND 2011  . CARDIAC CATHETERIZATION  1997  . CATARACT EXTRACTION, BILATERAL    . COLONOSCOPY X 3    . GREEN LIGHT LASER TURP (TRANSURETHRAL RESECTION OF PROSTATE N/A 02/26/2012   Procedure: GREEN LIGHT LASER TURP (TRANSURETHRAL RESECTION OF PROSTATE;  Surgeon: Ailene Rud, MD;  Location: [facility];  Service: Urology;  Laterality: N/A;  . HERNIA REPAIR     1948 INGUINAL HERNIA REPAIR; [place]; 8182 UMBILICAL HERNIA REPAIR  . PROSTATE BIOSPSIES X 7    . TONSILLECTOMY  1948    FAMILY HISTORY: Family History  Problem Relation Age of Onset  . Colon cancer Mother   . Heart failure Mother   . Congestive Heart Failure Mother   . Prostate cancer Father   . Lung cancer Father   . Prostate cancer Maternal Grandfather     SOCIAL HISTORY:  Social History   Social History  . Marital status: Married    Spouse name: N/A  . Number of children: 3  . Years of education: MD   Occupational History  . Retired Pharmacist, community    Social History Main Topics  . Smoking status: Former Smoker    Packs/day: 2.00    Years: 3.00    Types: Cigarettes  . Smokeless tobacco: Never Used     Comment: QUIT SMOKING 1974  . Alcohol use Yes     Comment: wine on weekends  . Drug use: No  . Sexual activity: Not on file   Other Topics Concern  . Not on file   Social History Narrative   Lives at home with his wife.   Left-handed.   2 cups caffeine per day.        PHYSICAL EXAM   Vitals:   09/03/16 0742  BP: (!) 142/96  Pulse:  77  Weight: 238 lb (108 kg)  Height: 5\' 11"  (1.803 m)    Not recorded      Body mass index is 33.19 kg/m.  PHYSICAL EXAMNIATION:  Gen: NAD, conversant, well nourised, obese, well groomed                     Cardiovascular: Regular rate rhythm, no peripheral edema, warm, nontender. Eyes: Conjunctivae clear without exudates or hemorrhage Neck: Supple, no carotid bruits.  Pulmonary: Clear to auscultation bilaterally   NEUROLOGICAL EXAM:  MMSE - Mini Mental State Exam 09/03/2016  Orientation to time 5  Orientation to Place 5  Registration 3  Attention/ Calculation 1  Recall 0  Language- name 2 objects 2  Language- repeat 1  Language- follow 3 step command 3  Language- read & follow direction 1  Write a sentence 1  Copy design 1  Total score 23  animal naming 6.   CRANIAL NERVES: CN II: Visual fields are full to confrontation. Fundoscopic exam is normal with sharp discs and no vascular changes. Pupils are round equal and briskly reactive to light. CN III, IV, VI: extraocular movement are normal. No ptosis. CN V: Facial sensation is intact to pinprick in all 3 divisions bilaterally. Corneal responses are intact.  CN VII: Face is symmetric with normal eye closure and smile. CN VIII: Hearing is normal to rubbing fingers CN IX, X: Palate elevates symmetrically. Phonation is normal. CN XI: Head turning and shoulder shrug are intact CN XII: Tongue is midline with normal movements and no atrophy.  MOTOR: There is no pronator drift of out-stretched arms. Muscle bulk and tone are normal. Muscle strength is normal. Palmomental signs  REFLEXES: Reflexes are 2+ and symmetric at the biceps, triceps, knees, and ankles. Plantar responses are flexor.  SENSORY: Intact to light touch, pinprick, positional sensation and vibratory sensation are intact in fingers and toes.  COORDINATION: Rapid alternating movements and fine finger movements are intact. There is no dysmetria on  finger-to-nose and heel-knee-shin.    GAIT/STANCE: Lean forward, steady, smooth turning Romberg is absent.   DIAGNOSTIC DATA (LABS, IMAGING, TESTING) - I reviewed patient records, labs, notes, testing and imaging myself where available.   ASSESSMENT AND PLAN  ARDIAN HABERLAND is a 71 y.o. male   Dementia  Will get MRI of the brain, and laboratory evaluation record from [place] 10 mg twice a day, Aricept 10 mg daily  I encouraged her moderate exercise  Return to clinic in 2 months   Marcial Pacas, M.D. Ph.D.  [facility] [address], [place], [place] [address] Ph: [phone] Fax: [phone]  CC: Helen Hashimoto., MD

## 2016-09-04 NOTE — Telephone Encounter (Signed)
Please call back pharmacy and the patient  Hold off Aricept for now, to avoid cardiac complications

## 2016-09-04 NOTE — Telephone Encounter (Signed)
LMOM pt's cell# not to take Aricept for now, due to potential interaction with Tambocor.  I also spoke with the pharmacist at [facility] and requested they not fill Aricept rx.; advise pt. of potential interaction and YY would like for him not to take Aricept right now.  They can discuss further at next ov/fim

## 2016-09-04 NOTE — Telephone Encounter (Signed)
Daniel from [phone]) has called back because he has not heard back from his message on 8-30 re: the interaction  of the Aricept and the Tambocor.  He wants to know if he should still fill, please call

## 2016-09-09 ENCOUNTER — Encounter: Payer: Self-pay | Admitting: *Deleted

## 2016-09-09 NOTE — Telephone Encounter (Signed)
Pt wife(on DPR) calling asking for a call back, she and pt are aware not to Aricept but pt has not been advised of what to take at this time. Wife is asking for a call back as to what to do at this time

## 2016-09-09 NOTE — Telephone Encounter (Signed)
Spoke to patient's wife - he will just continue to take Namenda 10mg , BID and keep his appt next month.

## 2016-09-11 ENCOUNTER — Telehealth: Payer: Self-pay | Admitting: Neurology

## 2016-09-11 NOTE — Telephone Encounter (Signed)
I reviewed the laboratory evaluation on July 31 2016, normal CBC, hemoglobin of 14.5, UA showed 1 plus protein, CMP showed normal glucose 92 creatinine 0.7, troponin was negative, INR was 1.2, blood culture was negative twice,  Left upper extremity venous Doppler no evidence of DVT,   CT angiogram of the chest no acute cardiopulmonary disease, no evidence of pulmonary emboli,  MRI of the brain atrophy, mild chronic microvascular ischemia, this was done at [facility] radiology   CT head without contrast, no acute abnormality, atrophy, small vessel disease  Chest x-ray no acute disease

## 2016-10-20 DIAGNOSIS — L570 Actinic keratosis: Secondary | ICD-10-CM | POA: Diagnosis not present

## 2016-10-20 DIAGNOSIS — L578 Other skin changes due to chronic exposure to nonionizing radiation: Secondary | ICD-10-CM | POA: Diagnosis not present

## 2016-10-20 DIAGNOSIS — L821 Other seborrheic keratosis: Secondary | ICD-10-CM | POA: Diagnosis not present

## 2016-10-20 DIAGNOSIS — C44222 Squamous cell carcinoma of skin of right ear and external auricular canal: Secondary | ICD-10-CM | POA: Diagnosis not present

## 2016-10-21 DIAGNOSIS — I4891 Unspecified atrial fibrillation: Secondary | ICD-10-CM | POA: Diagnosis not present

## 2016-10-21 DIAGNOSIS — I10 Essential (primary) hypertension: Secondary | ICD-10-CM | POA: Diagnosis not present

## 2016-10-21 DIAGNOSIS — Z79899 Other long term (current) drug therapy: Secondary | ICD-10-CM | POA: Diagnosis not present

## 2016-10-21 DIAGNOSIS — E785 Hyperlipidemia, unspecified: Secondary | ICD-10-CM | POA: Diagnosis not present

## 2016-10-21 DIAGNOSIS — I440 Atrioventricular block, first degree: Secondary | ICD-10-CM | POA: Diagnosis not present

## 2016-10-21 DIAGNOSIS — F0390 Unspecified dementia without behavioral disturbance: Secondary | ICD-10-CM | POA: Diagnosis not present

## 2016-10-21 DIAGNOSIS — Z6832 Body mass index (BMI) 32.0-32.9, adult: Secondary | ICD-10-CM | POA: Diagnosis not present

## 2016-10-21 DIAGNOSIS — K219 Gastro-esophageal reflux disease without esophagitis: Secondary | ICD-10-CM | POA: Diagnosis not present

## 2016-11-03 ENCOUNTER — Encounter: Payer: Self-pay | Admitting: Neurology

## 2016-11-03 ENCOUNTER — Encounter (INDEPENDENT_AMBULATORY_CARE_PROVIDER_SITE_OTHER): Payer: Self-pay

## 2016-11-03 ENCOUNTER — Ambulatory Visit (INDEPENDENT_AMBULATORY_CARE_PROVIDER_SITE_OTHER): Payer: Medicare Other | Admitting: Neurology

## 2016-11-03 VITALS — BP 130/92 | HR 82 | Ht 71.0 in | Wt 249.5 lb

## 2016-11-03 DIAGNOSIS — G3184 Mild cognitive impairment, so stated: Secondary | ICD-10-CM

## 2016-11-03 NOTE — Progress Notes (Signed)
PATIENT: Mitchell Miles DOB: 1945-05-02  Chief Complaint  Patient presents with  . Memory Loss    MMSE 28/30 - 12 animals.  He is here with his wife, Mitchell Miles.  He has continued taking Namenda 10mg , BID.  No new concerns with his memory.  He was unable to start Aricept due to being on Tambocor.     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  In 2015, he was noted to have gradually onset memory loss, also personality change, he has lost his professional interest, forgot appointment, forget to document, symptoms gradually become more frequent more obvious, especially since his most recent left arm cellulitis in July 2017 which require hospital admission, he was noted to have increased confusion, he was also noted to feel frustrated easily, become sedentary.  During his hospital admission in July 2018, he had MRI of  brain, laboratory evaluations,  He was started on Namenda 10 mg twice a day since 2017, initially noticed mild improvement.  His paternal grandfather suffered Alzheimer's disease, his father has Parkinson's disease  UPDATE Nov 03 2016: I reviewed the laboratory evaluation on July 31 2016, normal CBC, hemoglobin of 14.5, UA showed 1 plus protein, CMP showed normal glucose 92 creatinine 0.7, troponin was negative, INR was 1.2, blood culture was negative twice,  Left upper extremity venous Doppler no evidence of DVT,   CT angiogram of the chest no acute cardiopulmonary disease, no evidence of pulmonary emboli,  MRI of the brainIn July 2018 showed moderate atrophy, mild chronic microvascular ischemia, this was done at [facility] radiology   CT head without contrast, no acute abnormality, atrophy, small vessel  disease  Chest x-ray no acute disease  He is taking namenda 10mg  twice a day, he had few times of temper outburst, he retired as Pharmacist, community. He could not take aricept due to flecainide.  REVIEW OF SYSTEMS: Full 14 system review of systems performed and notable only for bruise easily, memory loss, joint pain, ringing ears  ALLERGIES: Allergies  Allergen Reactions  . Other Other (See Comments)    Rhinitis  . Betadine [Povidone Iodine] Rash    HOME MEDICATIONS: Current Outpatient Prescriptions  Medication Sig Dispense Refill  . Ascorbic Acid (VITAMIN C) 1000 MG tablet Take 1,000 mg by mouth daily.    . bisoprolol (ZEBETA) 5 MG tablet Take 5 mg by mouth daily before breakfast.    . dabigatran (PRADAXA) 150 MG CAPS Take 150 mg by mouth every 12 (twelve) hours.    . flecainide (TAMBOCOR) 100 MG tablet Take 100 mg by mouth 2 (two) times daily.    Marland Kitchen lisinopril (PRINIVIL,ZESTRIL) 10 MG tablet Take 10 mg by mouth daily before breakfast.    . Lycopene 10 MG CAPS Take 2 capsules by mouth daily.    . Magnesium 250 MG TABS Take 1 tablet by mouth daily.    Marland Kitchen MAGNESIUM PO Take 400 mg by mouth daily.    . memantine (NAMENDA) 10 MG tablet Take 1 tablet (10 mg total) by mouth 2 (two) times daily. 180 tablet 4  . Multiple Vitamin (MULTIVITAMIN WITH MINERALS) TABS Take 1 tablet by mouth daily.    . Omega-3 Fatty Acids (FISH OIL) 1200 MG CAPS Take 1 capsule by  mouth daily.    Marland Kitchen OVER THE COUNTER MEDICATION Take 1 capsule by mouth 2 (two) times daily. Tumeric 500 mg    . OVER THE COUNTER MEDICATION Take 1 tablet by mouth daily. Pygeum 1000 mg    . pravastatin (PRAVACHOL) 40 MG tablet Take 40 mg by mouth every evening.     No current facility-administered medications for this visit.     PAST MEDICAL HISTORY: Past Medical History:  Diagnosis Date  . Arthritis   . Atrial fibrillation ([place])   . AV block, 1st degree   . BPH (benign prostatic hypertrophy)    URINARY FREQUENCY AND NOCTURIA  . Dermatitis,  disseminated superficial actinic porokeratosis (DSAP)   . DSAP (disseminated superficial actinic porokeratosis)   . Dysrhythmia    PAST HX OF ATRIAL FIB / FLUTTER-CARDIAC ABLATIONS X 2 AND ON CHRONIC PRADAXA, FLECAINIDE.  CARDIOLGIST IS DR. ZAN TYSON [place]   . GERD (gastroesophageal reflux disease)   . Hyperlipidemia   . Hypertension   . Memory loss   . Osteoarthritis   . Problems with hearing    HAS HEARING AIDS    PAST SURGICAL HISTORY: Past Surgical History:  Procedure Laterality Date  . CARDIAC ABLATIONS X2     2008 AND 2011  . CARDIAC CATHETERIZATION  1997  . CATARACT EXTRACTION, BILATERAL    . COLONOSCOPY X 3    . GREEN LIGHT LASER TURP (TRANSURETHRAL RESECTION OF PROSTATE N/A 02/26/2012   Procedure: GREEN LIGHT LASER TURP (TRANSURETHRAL RESECTION OF PROSTATE;  Surgeon: Ailene Rud, MD;  Location: [facility];  Service: Urology;  Laterality: N/A;  . HERNIA REPAIR     1948 INGUINAL HERNIA REPAIR; [place]; 1610 UMBILICAL HERNIA REPAIR  . PROSTATE BIOSPSIES X 7    . TONSILLECTOMY  1948    FAMILY HISTORY: Family History  Problem Relation Age of Onset  . Colon cancer Mother   . Heart failure Mother   . Congestive Heart Failure Mother   . Prostate cancer Father   . Lung cancer Father   . Prostate cancer Maternal Grandfather     SOCIAL HISTORY:  Social History   Social History  . Marital status: Married    Spouse name: N/A  . Number of children: 3  . Years of education: MD   Occupational History  . Retired Pharmacist, community    Social History Main Topics  . Smoking status: Former Smoker    Packs/day: 2.00    Years: 3.00    Types: Cigarettes  . Smokeless tobacco: Never Used     Comment: QUIT SMOKING 1974  . Alcohol use Yes     Comment: wine on weekends  . Drug use: No  . Sexual activity: Not on file   Other Topics Concern  . Not on file   Social History Narrative   Lives at home with his wife.   Left-handed.   2 cups  caffeine per day.        PHYSICAL EXAM   Vitals:   11/03/16 0837  BP: (!) 130/92  Pulse: 82  Weight: 249 lb 8 oz (113.2 kg)  Height: 5\' 11"  (1.803 m)    Not recorded      Body mass index is 34.8 kg/m.  PHYSICAL EXAMNIATION:  Gen: NAD, conversant, well nourised, obese, well groomed                     Cardiovascular: Regular rate rhythm, no peripheral edema, warm, nontender. Eyes:  Conjunctivae clear without exudates or hemorrhage Neck: Supple, no carotid bruits. Pulmonary: Clear to auscultation bilaterally   NEUROLOGICAL EXAM:  MMSE - Mini Mental State Exam 11/03/2016 09/03/2016  Orientation to time 5 5  Orientation to Place 5 5  Registration 3 3  Attention/ Calculation 5 1  Recall 1 0  Language- name 2 objects 2 2  Language- repeat 1 1  Language- follow 3 step command 3 3  Language- read & follow direction 1 1  Write a sentence 1 1  Copy design 1 1  Total score 28 23  animal naming 12.   CRANIAL NERVES: CN II: Visual fields are full to confrontation. Fundoscopic exam is normal with sharp discs and no vascular changes. Pupils are round equal and briskly reactive to light. CN III, IV, VI: extraocular movement are normal. No ptosis. CN V: Facial sensation is intact to pinprick in all 3 divisions bilaterally. Corneal responses are intact.  CN VII: Face is symmetric with normal eye closure and smile. CN VIII: Hearing is normal to rubbing fingers CN IX, X: Palate elevates symmetrically. Phonation is normal. CN XI: Head turning and shoulder shrug are intact CN XII: Tongue is midline with normal movements and no atrophy.  MOTOR: There is no pronator drift of out-stretched arms. Muscle bulk and tone are normal. Muscle strength is normal. Palmomental signs  REFLEXES: Reflexes are 2+ and symmetric at the biceps, triceps, knees, and ankles. Plantar responses are flexor.  SENSORY: Intact to light touch, pinprick, positional sensation and vibratory sensation are intact  in fingers and toes.  COORDINATION: Rapid alternating movements and fine finger movements are intact. There is no dysmetria on finger-to-nose and heel-knee-shin.    GAIT/STANCE: Lean forward, steady, smooth turning, big body habitus Romberg is absent.   DIAGNOSTIC DATA (LABS, IMAGING, TESTING) - I reviewed patient records, labs, notes, testing and imaging myself where available.   ASSESSMENT AND PLAN  Mitchell Miles is a 71 y.o. male   Mild Cognitive Impairment  Continue namenda 10mg  bid  Could not take Aricept because he is on flecainide.  Introduce him to research trial  Check TSH, B12  Encouraged him moderate exercise  Return to clinic in 6 months  Marcial Pacas, M.D. Ph.D.  [facility] [address], [place], [place] [address] Ph: [phone] Fax: [phone]  CC: Helen Hashimoto., MD

## 2016-11-04 ENCOUNTER — Telehealth: Payer: Self-pay | Admitting: *Deleted

## 2016-11-04 DIAGNOSIS — Z23 Encounter for immunization: Secondary | ICD-10-CM | POA: Diagnosis not present

## 2016-11-04 DIAGNOSIS — Z7901 Long term (current) use of anticoagulants: Secondary | ICD-10-CM

## 2016-11-04 DIAGNOSIS — I4891 Unspecified atrial fibrillation: Secondary | ICD-10-CM | POA: Insufficient documentation

## 2016-11-04 DIAGNOSIS — I10 Essential (primary) hypertension: Secondary | ICD-10-CM

## 2016-11-04 HISTORY — DX: Long term (current) use of anticoagulants: Z79.01

## 2016-11-04 HISTORY — DX: Essential (primary) hypertension: I10

## 2016-11-04 LAB — VITAMIN B12: VITAMIN B 12: 359 pg/mL (ref 232–1245)

## 2016-11-04 LAB — TSH: TSH: 1.97 u[IU]/mL (ref 0.450–4.500)

## 2016-11-04 LAB — RPR: RPR Ser Ql: NONREACTIVE

## 2016-11-04 NOTE — Telephone Encounter (Signed)
Spoke to his wife on HIPAA - she is aware of the lab results.

## 2016-11-04 NOTE — Telephone Encounter (Signed)
-----   Message from Marcial Pacas, MD sent at 11/04/2016  1:16 PM EDT ----- Please call patient for normal laboratory result

## 2016-11-30 DIAGNOSIS — J342 Deviated nasal septum: Secondary | ICD-10-CM | POA: Diagnosis not present

## 2016-11-30 DIAGNOSIS — I4891 Unspecified atrial fibrillation: Secondary | ICD-10-CM | POA: Diagnosis not present

## 2016-11-30 DIAGNOSIS — I10 Essential (primary) hypertension: Secondary | ICD-10-CM | POA: Diagnosis not present

## 2016-11-30 DIAGNOSIS — R040 Epistaxis: Secondary | ICD-10-CM | POA: Diagnosis not present

## 2016-11-30 DIAGNOSIS — Z7901 Long term (current) use of anticoagulants: Secondary | ICD-10-CM | POA: Diagnosis not present

## 2016-12-22 DIAGNOSIS — Z961 Presence of intraocular lens: Secondary | ICD-10-CM | POA: Diagnosis not present

## 2016-12-22 DIAGNOSIS — H26493 Other secondary cataract, bilateral: Secondary | ICD-10-CM | POA: Diagnosis not present

## 2017-01-19 DIAGNOSIS — L578 Other skin changes due to chronic exposure to nonionizing radiation: Secondary | ICD-10-CM | POA: Diagnosis not present

## 2017-01-19 DIAGNOSIS — L820 Inflamed seborrheic keratosis: Secondary | ICD-10-CM | POA: Diagnosis not present

## 2017-01-19 DIAGNOSIS — L570 Actinic keratosis: Secondary | ICD-10-CM | POA: Diagnosis not present

## 2017-01-19 DIAGNOSIS — L821 Other seborrheic keratosis: Secondary | ICD-10-CM | POA: Diagnosis not present

## 2017-01-20 DIAGNOSIS — R972 Elevated prostate specific antigen [PSA]: Secondary | ICD-10-CM | POA: Diagnosis not present

## 2017-02-05 DIAGNOSIS — E785 Hyperlipidemia, unspecified: Secondary | ICD-10-CM | POA: Diagnosis not present

## 2017-02-05 DIAGNOSIS — Z1331 Encounter for screening for depression: Secondary | ICD-10-CM | POA: Diagnosis not present

## 2017-02-05 DIAGNOSIS — Z9181 History of falling: Secondary | ICD-10-CM | POA: Diagnosis not present

## 2017-02-05 DIAGNOSIS — Z0000 Encounter for general adult medical examination without abnormal findings: Secondary | ICD-10-CM | POA: Diagnosis not present

## 2017-02-05 DIAGNOSIS — Z125 Encounter for screening for malignant neoplasm of prostate: Secondary | ICD-10-CM | POA: Diagnosis not present

## 2017-02-05 DIAGNOSIS — Z6833 Body mass index (BMI) 33.0-33.9, adult: Secondary | ICD-10-CM | POA: Diagnosis not present

## 2017-03-10 DIAGNOSIS — R040 Epistaxis: Secondary | ICD-10-CM | POA: Diagnosis not present

## 2017-03-10 DIAGNOSIS — Z7901 Long term (current) use of anticoagulants: Secondary | ICD-10-CM | POA: Diagnosis not present

## 2017-03-10 DIAGNOSIS — I4891 Unspecified atrial fibrillation: Secondary | ICD-10-CM | POA: Diagnosis not present

## 2017-03-10 DIAGNOSIS — J342 Deviated nasal septum: Secondary | ICD-10-CM | POA: Diagnosis not present

## 2017-03-10 DIAGNOSIS — I10 Essential (primary) hypertension: Secondary | ICD-10-CM | POA: Diagnosis not present

## 2017-03-29 DIAGNOSIS — J209 Acute bronchitis, unspecified: Secondary | ICD-10-CM | POA: Diagnosis not present

## 2017-03-29 DIAGNOSIS — Z6834 Body mass index (BMI) 34.0-34.9, adult: Secondary | ICD-10-CM | POA: Diagnosis not present

## 2017-04-27 DIAGNOSIS — F0390 Unspecified dementia without behavioral disturbance: Secondary | ICD-10-CM | POA: Diagnosis not present

## 2017-04-27 DIAGNOSIS — I4891 Unspecified atrial fibrillation: Secondary | ICD-10-CM | POA: Diagnosis not present

## 2017-04-27 DIAGNOSIS — Z125 Encounter for screening for malignant neoplasm of prostate: Secondary | ICD-10-CM | POA: Diagnosis not present

## 2017-04-27 DIAGNOSIS — I10 Essential (primary) hypertension: Secondary | ICD-10-CM | POA: Diagnosis not present

## 2017-04-27 DIAGNOSIS — E785 Hyperlipidemia, unspecified: Secondary | ICD-10-CM | POA: Diagnosis not present

## 2017-05-04 ENCOUNTER — Ambulatory Visit (INDEPENDENT_AMBULATORY_CARE_PROVIDER_SITE_OTHER): Payer: Medicare Other | Admitting: Neurology

## 2017-05-04 ENCOUNTER — Encounter: Payer: Self-pay | Admitting: Neurology

## 2017-05-04 VITALS — BP 118/82 | HR 81 | Ht 71.0 in | Wt 235.5 lb

## 2017-05-04 DIAGNOSIS — G3184 Mild cognitive impairment, so stated: Secondary | ICD-10-CM | POA: Diagnosis not present

## 2017-05-04 NOTE — Progress Notes (Addendum)
PATIENT: Mitchell Miles DOB: 09-13-1945  Chief Complaint  Patient presents with  . Memory Loss    MMSE 27/30 - 10 animals.  He is here with his wife, Mitchell Miles.  Feels his memory is stable.     HISTORICAL  Mitchell VANOVERBEKE is a 72 years old male, seen in refer by his primary care physician Mitchell Miles.for evaluation of memory loss, initial evaluation was on May 04 2017.  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  In 2015, he was noted to have gradually onset memory loss, also personality change, he has lost his professional interest, forgot appointment, forget to document, symptoms gradually become more frequent more obvious, especially since his most recent left arm cellulitis in July 2017 which require hospital admission, he was noted to have increased confusion, he was also noted to feel frustrated easily, become sedentary.  During his hospital admission in July 2018, he had MRI of  brain, laboratory evaluations,  He was started on Namenda 10 mg twice a day since 2017, initially noticed mild improvement.  His paternal grandfather suffered Alzheimer's disease, his father has Parkinson's disease  UPDATE Nov 03 2016: I reviewed the laboratory evaluation on July 31 2016, normal CBC, hemoglobin of 14.5, UA showed 1 plus protein, CMP showed normal glucose 92 creatinine 0.7, troponin was negative, INR was 1.2, blood culture was negative twice,  Left upper extremity venous Doppler no evidence of DVT,   CT angiogram of the chest no acute cardiopulmonary disease, no evidence of pulmonary emboli,  MRI of the brainIn July 2018 showed moderate atrophy, mild chronic microvascular ischemia, this was done at [facility] radiology   CT head without contrast, no acute abnormality, atrophy,  small vessel disease  Chest x-ray no acute disease  He is taking namenda 10mg  twice a day, he had few times of temper outburst, he retired as Pharmacist, community. He could not take aricept due to flecainide.  UPDATE May 04 2017: He is accompanied by his wife at today's clinical visit, has retired, was able to exercise few times week, complains of low back pain, no radiating pain, urinary urgency, difficulty initiating urine, is under the care by the urologist, is tolerating Namenda twice a day, cannot take Aricept due to previous history of atrial fibrillation, status post ablation  REVIEW OF SYSTEMS: Full 14 system review of systems performed and notable only for runny nose, cough  ALLERGIES: Allergies  Allergen Reactions  . Other Other (See Comments)    Rhinitis  . Betadine [Povidone Iodine] Rash    HOME MEDICATIONS: Current Outpatient Medications  Medication Sig Dispense Refill  . Ascorbic Acid (VITAMIN C) 1000 MG tablet Take 1,000 mg by mouth daily.    . bisoprolol (ZEBETA) 5 MG tablet Take 5 mg by mouth daily before breakfast.    . dabigatran (PRADAXA) 150 MG CAPS Take 150 mg by mouth every 12 (twelve) hours.    . flecainide (TAMBOCOR) 100 MG tablet Take 100 mg by mouth 2 (two) times daily.    Marland Kitchen lisinopril (PRINIVIL,ZESTRIL) 10 MG tablet Take 10 mg by mouth daily before breakfast.    . Lycopene 10 MG CAPS Take 2 capsules by mouth daily.    . Magnesium 250 MG TABS Take 1 tablet by mouth daily.    Marland Kitchen MAGNESIUM PO Take 400 mg by  mouth daily.    . memantine (NAMENDA) 10 MG tablet Take 1 tablet (10 mg total) by mouth 2 (two) times daily. 180 tablet 4  . Multiple Vitamin (MULTIVITAMIN WITH MINERALS) TABS Take 1 tablet by mouth daily.    . Omega-3 Fatty Acids (FISH OIL) 1200 MG CAPS Take 1 capsule by mouth daily.    Marland Kitchen OVER THE COUNTER MEDICATION Take 1 capsule by mouth 2 (two) times daily. Tumeric 500 mg    . OVER THE COUNTER MEDICATION Take 1 tablet by mouth daily. Pygeum 1000 mg    .  pravastatin (PRAVACHOL) 40 MG tablet Take 40 mg by mouth every evening.     No current facility-administered medications for this visit.     PAST MEDICAL HISTORY: Past Medical History:  Diagnosis Date  . Arthritis   . Atrial fibrillation ([place])   . AV block, 1st degree   . BPH (benign prostatic hypertrophy)    URINARY FREQUENCY AND NOCTURIA  . Dermatitis, disseminated superficial actinic porokeratosis (DSAP)   . DSAP (disseminated superficial actinic porokeratosis)   . Dysrhythmia    PAST HX OF ATRIAL FIB / FLUTTER-CARDIAC ABLATIONS X 2 AND ON CHRONIC PRADAXA, FLECAINIDE.  CARDIOLGIST IS DR. ZAN TYSON [place]   . GERD (gastroesophageal reflux disease)   . Hyperlipidemia   . Hypertension   . Memory loss   . Osteoarthritis   . Problems with hearing    HAS HEARING AIDS    PAST SURGICAL HISTORY: Past Surgical History:  Procedure Laterality Date  . CARDIAC ABLATIONS X2     2008 AND 2011  . CARDIAC CATHETERIZATION  1997  . CATARACT EXTRACTION, BILATERAL    . COLONOSCOPY X 3    . GREEN LIGHT LASER TURP (TRANSURETHRAL RESECTION OF PROSTATE N/A 02/26/2012   Procedure: GREEN LIGHT LASER TURP (TRANSURETHRAL RESECTION OF PROSTATE;  Surgeon: Ailene Rud, MD;  Location: [facility];  Service: Urology;  Laterality: N/A;  . HERNIA REPAIR     1948 INGUINAL HERNIA REPAIR; [place]; 7628 UMBILICAL HERNIA REPAIR  . PROSTATE BIOSPSIES X 7    . TONSILLECTOMY  1948    FAMILY HISTORY: Family History  Problem Relation Age of Onset  . Colon cancer Mother   . Heart failure Mother   . Congestive Heart Failure Mother   . Prostate cancer Father   . Lung cancer Father   . Prostate cancer Maternal Grandfather     SOCIAL HISTORY:  Social History   Socioeconomic History  . Marital status: Married    Spouse name: Not on file  . Number of children: 3  . Years of education: MD  . Highest education level: Not on file  Occupational History  . Occupation:  Retired Librarian, academic  . Financial resource strain: Not on file  . Food insecurity:    Worry: Not on file    Inability: Not on file  . Transportation needs:    Medical: Not on file    Non-medical: Not on file  Tobacco Use  . Smoking status: Former Smoker    Packs/day: 2.00    Years: 3.00    Pack years: 6.00    Types: Cigarettes  . Smokeless tobacco: Never Used  . Tobacco comment: QUIT SMOKING 1974  Substance and Sexual Activity  . Alcohol use: Yes    Comment: wine on weekends  . Drug use: No  . Sexual activity: Not on file  Lifestyle  . Physical activity:    Days  per week: Not on file    Minutes per session: Not on file  . Stress: Not on file  Relationships  . Social connections:    Talks on phone: Not on file    Gets together: Not on file    Attends religious service: Not on file    Active member of club or organization: Not on file    Attends meetings of clubs or organizations: Not on file    Relationship status: Not on file  . Intimate partner violence:    Fear of current or ex partner: Not on file    Emotionally abused: Not on file    Physically abused: Not on file    Forced sexual activity: Not on file  Other Topics Concern  . Not on file  Social History Narrative   Lives at home with his wife.   Left-handed.   2 cups caffeine per day.     PHYSICAL EXAM   Vitals:   05/04/17 1050  BP: 118/82  Pulse: 81  Weight: 235 lb 8 oz (106.8 kg)  Height: 5\' 11"  (1.803 m)    Not recorded      Body mass index is 32.85 kg/m.  PHYSICAL EXAMNIATION:  Gen: NAD, conversant, well nourised, obese, well groomed                     Cardiovascular: Regular rate rhythm, no peripheral edema, warm, nontender. Eyes: Conjunctivae clear without exudates or hemorrhage Neck: Supple, no carotid bruits. Pulmonary: Clear to auscultation bilaterally   NEUROLOGICAL EXAM:  MMSE - Mini Mental State Exam 05/04/2017 11/03/2016 09/03/2016  Orientation to time 5 5 5     Orientation to Place 5 5 5   Registration 3 3 3   Attention/ Calculation 5 5 1   Recall 0 1 0  Language- name 2 objects 2 2 2   Language- repeat 1 1 1   Language- follow 3 step command 3 3 3   Language- read & follow direction 1 1 1   Write a sentence 1 1 1   Copy design 1 1 1   Total score 27 28 23   animal naming 10   CRANIAL NERVES: CN II: Visual fields are full to confrontation. Fundoscopic exam is normal with sharp discs and no vascular changes. Pupils are round equal and briskly reactive to light. CN III, IV, VI: extraocular movement are normal. No ptosis. CN V: Facial sensation is intact to pinprick in all 3 divisions bilaterally. Corneal responses are intact.  CN VII: Face is symmetric with normal eye closure and smile. CN VIII: Hearing is normal to rubbing fingers CN IX, X: Palate elevates symmetrically. Phonation is normal. CN XI: Head turning and shoulder shrug are intact CN XII: Tongue is midline with normal movements and no atrophy.  MOTOR: There is no pronator drift of out-stretched arms. Muscle bulk and tone are normal. Muscle strength is normal. Palmomental signs  REFLEXES: Reflexes are 2+ and symmetric at the biceps, triceps, knees, and ankles. Plantar responses are flexor.  SENSORY: Intact to light touch, pinprick, positional sensation and vibratory sensation are intact in fingers and toes.  COORDINATION: Rapid alternating movements and fine finger movements are intact. There is no dysmetria on finger-to-nose and heel-knee-shin.    GAIT/STANCE: Lean forward, steady, smooth turning, big body habitus Romberg is absent.   DIAGNOSTIC DATA (LABS, IMAGING, TESTING) - I reviewed patient records, labs, notes, testing and imaging myself where available.   ASSESSMENT AND PLAN  VIVIANO BIR is a 71 y.o. male  Mild Cognitive Impairment  Continue namenda 10mg  bid  Could not take Aricept because previous history of atrial fibrillation, status post ablation, he is on  flecainide.  Introduce him to research trial, information was given on May 04, 2017,  Normal TSH, B12  Encouraged him moderate exercise  Return to clinic in 6 months with nurse practitioner  Marcial Pacas, M.Miles. Ph.Miles.  [facility] [address], [place], [place] [address] Ph: [phone] Fax: [phone]  CC: Mitchell Miles., MD

## 2017-05-12 ENCOUNTER — Other Ambulatory Visit: Payer: Self-pay | Admitting: Neurology

## 2017-05-12 DIAGNOSIS — G309 Alzheimer's disease, unspecified: Secondary | ICD-10-CM

## 2017-05-18 DIAGNOSIS — L821 Other seborrheic keratosis: Secondary | ICD-10-CM | POA: Diagnosis not present

## 2017-05-18 DIAGNOSIS — L578 Other skin changes due to chronic exposure to nonionizing radiation: Secondary | ICD-10-CM | POA: Diagnosis not present

## 2017-05-18 DIAGNOSIS — L570 Actinic keratosis: Secondary | ICD-10-CM | POA: Diagnosis not present

## 2017-06-02 ENCOUNTER — Inpatient Hospital Stay: Admission: RE | Admit: 2017-06-02 | Payer: Medicare Other | Source: Ambulatory Visit

## 2017-07-16 DIAGNOSIS — Z6833 Body mass index (BMI) 33.0-33.9, adult: Secondary | ICD-10-CM | POA: Diagnosis not present

## 2017-07-16 DIAGNOSIS — R159 Full incontinence of feces: Secondary | ICD-10-CM | POA: Diagnosis not present

## 2017-07-16 DIAGNOSIS — Z79899 Other long term (current) drug therapy: Secondary | ICD-10-CM | POA: Diagnosis not present

## 2017-07-19 DIAGNOSIS — L820 Inflamed seborrheic keratosis: Secondary | ICD-10-CM | POA: Diagnosis not present

## 2017-08-11 DIAGNOSIS — K5730 Diverticulosis of large intestine without perforation or abscess without bleeding: Secondary | ICD-10-CM | POA: Diagnosis not present

## 2017-09-28 DIAGNOSIS — Z23 Encounter for immunization: Secondary | ICD-10-CM | POA: Diagnosis not present

## 2017-10-18 DIAGNOSIS — R531 Weakness: Secondary | ICD-10-CM | POA: Diagnosis not present

## 2017-10-18 DIAGNOSIS — S0990XA Unspecified injury of head, initial encounter: Secondary | ICD-10-CM | POA: Diagnosis not present

## 2017-10-18 DIAGNOSIS — R351 Nocturia: Secondary | ICD-10-CM | POA: Diagnosis not present

## 2017-10-20 DIAGNOSIS — L03116 Cellulitis of left lower limb: Secondary | ICD-10-CM | POA: Diagnosis not present

## 2017-10-20 DIAGNOSIS — Z79899 Other long term (current) drug therapy: Secondary | ICD-10-CM | POA: Diagnosis not present

## 2017-10-20 DIAGNOSIS — F0390 Unspecified dementia without behavioral disturbance: Secondary | ICD-10-CM | POA: Diagnosis not present

## 2017-10-20 DIAGNOSIS — Z6833 Body mass index (BMI) 33.0-33.9, adult: Secondary | ICD-10-CM | POA: Diagnosis not present

## 2017-10-25 DIAGNOSIS — R2681 Unsteadiness on feet: Secondary | ICD-10-CM | POA: Diagnosis not present

## 2017-10-25 DIAGNOSIS — R2689 Other abnormalities of gait and mobility: Secondary | ICD-10-CM | POA: Diagnosis not present

## 2017-10-25 DIAGNOSIS — M6281 Muscle weakness (generalized): Secondary | ICD-10-CM | POA: Diagnosis not present

## 2017-10-28 DIAGNOSIS — M6281 Muscle weakness (generalized): Secondary | ICD-10-CM | POA: Diagnosis not present

## 2017-10-28 DIAGNOSIS — R2681 Unsteadiness on feet: Secondary | ICD-10-CM | POA: Diagnosis not present

## 2017-10-28 DIAGNOSIS — R2689 Other abnormalities of gait and mobility: Secondary | ICD-10-CM | POA: Diagnosis not present

## 2017-11-01 DIAGNOSIS — R2689 Other abnormalities of gait and mobility: Secondary | ICD-10-CM | POA: Diagnosis not present

## 2017-11-01 DIAGNOSIS — M6281 Muscle weakness (generalized): Secondary | ICD-10-CM | POA: Diagnosis not present

## 2017-11-01 DIAGNOSIS — R2681 Unsteadiness on feet: Secondary | ICD-10-CM | POA: Diagnosis not present

## 2017-11-02 NOTE — Progress Notes (Signed)
[facility]  PATIENT: Mitchell Miles DOB: 08-25-1945   REASON FOR VISIT: Follow-up for mild cognitive impairment, recent fall brief episode of aphasia HISTORY FROM:patient and wife    HISTORY OF PRESENT ILLNESS: Mitchell Miles is a 72 years old male, seen in refer by his primary care physician Gerilyn Pilgrim D.for evaluation of memory loss, initial evaluation was on May 04 2017.  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  In 2015, he was noted to have gradually onset memory loss, also personality change, he has lost his professional interest, forgot appointment, forget to document, symptoms gradually become more frequent more obvious, especially since his most recent left arm cellulitis in July 2017 which require hospital admission, he was noted to have increased confusion, he was also noted to feel frustrated easily, become sedentary.  During his hospital admission in July 2018, he had MRI of  brain, laboratory evaluations,  He was started on Namenda 10 mg twice a day since 2017, initially noticed mild improvement.  His paternal grandfather suffered Alzheimer's disease, his father has Parkinson's disease  UPDATE Nov 03 2016: I reviewed the laboratory evaluation on July 31 2016, normal CBC, hemoglobin of 14.5, UA showed 1 plus protein, CMP showed normal glucose 92 creatinine 0.7, troponin was negative, INR was 1.2, blood culture was negative twice,  Left upper extremity venous Doppler no evidence of DVT,   CT angiogram of the chest no acute cardiopulmonary disease, no evidence of pulmonary emboli,  MRI of the brainIn July 2018 showed moderate atrophy, mild chronic microvascular ischemia, this was done at [facility] radiology   CT head without  contrast, no acute abnormality, atrophy, small vessel disease  Chest x-ray no acute disease  He is taking namenda 10mg  twice a day, he had few times of temper outburst, he retired as Pharmacist, community. He could not take aricept due to flecainide.  UPDATE May 04 2017:YY He is accompanied by his wife at today's clinical visit, has retired, was able to exercise few times week, complains of low back pain, no radiating pain, urinary urgency, difficulty initiating urine, is under the care by the urologist, is tolerating Namenda twice a day, cannot take Aricept due to previous history of atrial fibrillation, status post ablation UPDATE 10/30/2019CM [name], 72 year old male returns for follow-up company by his wife.  He is here for mild cognitive impairment.  Wife reports he fell in the bathroom on 10/18/2017 did not call to her when she found him he was unable to speak.  Had another fall the next day.  Seen at [facility] CT of the head was done.  No acute abnormality according to the wife.  Patient has a history of hypertension , atrial fibrillation and hypercholesterolemia.  His mild cognitive impairment is stable.  He is currently on Namenda 10 mg twice daily.  He is unable to take Aricept off status post ablation and history of atrial fib.  He also complains of daytime sleepiness however when asked if he would have a sleep study he states he would not wear CPAP.  He returns for reevaluation.  REVIEW OF SYSTEMS: Full 14 system review of systems performed and notable only for those listed, all others are neg:  Constitutional: neg  Cardiovascular: neg Ear/Nose/Throat: neg  Skin: neg Eyes: neg Respiratory: neg Gastroitestinal: neg  Hematology/Lymphatic:  neg  Endocrine: neg Musculoskeletal: Fall risk Allergy/Immunology: neg Neurological: Memory loss Psychiatric: neg Sleep : Snoring daytime sleepiness.  Not interested in sleep study   ALLERGIES: Allergies  Allergen Reactions  . Other Other  (See Comments)    Rhinitis  . Betadine [Povidone Iodine] Rash    HOME MEDICATIONS: Outpatient Medications Prior to Visit  Medication Sig Dispense Refill  . Ascorbic Acid (VITAMIN C) 1000 MG tablet Take 1,000 mg by mouth daily.    . bisoprolol (ZEBETA) 5 MG tablet Take 5 mg by mouth daily before breakfast.    . dabigatran (PRADAXA) 150 MG CAPS Take 150 mg by mouth every 12 (twelve) hours.    . flecainide (TAMBOCOR) 100 MG tablet Take 100 mg by mouth 2 (two) times daily.    Marland Kitchen lisinopril (PRINIVIL,ZESTRIL) 10 MG tablet Take 10 mg by mouth daily before breakfast.    . Lycopene 10 MG CAPS Take 2 capsules by mouth daily.    . Magnesium 250 MG TABS Take 1 tablet by mouth daily.    Marland Kitchen MAGNESIUM PO Take 400 mg by mouth daily.    . memantine (NAMENDA) 10 MG tablet Take 1 tablet (10 mg total) by mouth 2 (two) times daily. 180 tablet 4  . Multiple Vitamin (MULTIVITAMIN WITH MINERALS) TABS Take 1 tablet by mouth daily.    . Omega-3 Fatty Acids (FISH OIL) 1200 MG CAPS Take 1 capsule by mouth daily.    Marland Kitchen OVER THE COUNTER MEDICATION Take 1 capsule by mouth 2 (two) times daily. Tumeric 500 mg    . OVER THE COUNTER MEDICATION Take 1 tablet by mouth daily. Pygeum 1000 mg    . pravastatin (PRAVACHOL) 40 MG tablet Take 40 mg by mouth every evening.     No facility-administered medications prior to visit.     PAST MEDICAL HISTORY: Past Medical History:  Diagnosis Date  . Arthritis   . Atrial fibrillation ([place])   . AV block, 1st degree   . BPH (benign prostatic hypertrophy)    URINARY FREQUENCY AND NOCTURIA  . Dermatitis, disseminated superficial actinic porokeratosis (DSAP)   . DSAP (disseminated superficial actinic porokeratosis)   . Dysrhythmia    PAST HX OF ATRIAL FIB / FLUTTER-CARDIAC ABLATIONS X 2 AND ON CHRONIC PRADAXA, FLECAINIDE.  CARDIOLGIST IS DR. ZAN [facility]   . GERD (gastroesophageal reflux disease)   . Hyperlipidemia   . Hypertension   . Memory loss   . Osteoarthritis   .  Problems with hearing    HAS HEARING AIDS    PAST SURGICAL HISTORY: Past Surgical History:  Procedure Laterality Date  . CARDIAC ABLATIONS X2     2008 AND 2011  . CARDIAC CATHETERIZATION  1997  . CATARACT EXTRACTION, BILATERAL    . COLONOSCOPY X 3    . GREEN LIGHT LASER TURP (TRANSURETHRAL RESECTION OF PROSTATE N/A 02/26/2012   Procedure: GREEN LIGHT LASER TURP (TRANSURETHRAL RESECTION OF PROSTATE;  Surgeon: Ailene Rud, MD;  Location: [facility];  Service: Urology;  Laterality: N/A;  . HERNIA REPAIR     1948 INGUINAL HERNIA REPAIR; [place]; 9563 UMBILICAL HERNIA REPAIR  . PROSTATE BIOSPSIES X 7    . TONSILLECTOMY  1948    FAMILY HISTORY: Family History  Problem Relation Age of Onset  . Colon cancer Mother   . Heart failure Mother   . Congestive Heart Failure Mother   . Prostate cancer Father   . Lung cancer Father   . Prostate cancer  Maternal Grandfather     SOCIAL HISTORY: Social History   Socioeconomic History  . Marital status: Married    Spouse name: Not on file  . Number of children: 3  . Years of education: MD  . Highest education level: Not on file  Occupational History  . Occupation: Retired Librarian, academic  . Financial resource strain: Not on file  . Food insecurity:    Worry: Not on file    Inability: Not on file  . Transportation needs:    Medical: Not on file    Non-medical: Not on file  Tobacco Use  . Smoking status: Former Smoker    Packs/day: 2.00    Years: 3.00    Pack years: 6.00    Types: Cigarettes  . Smokeless tobacco: Never Used  . Tobacco comment: QUIT SMOKING 1974  Substance and Sexual Activity  . Alcohol use: Yes    Comment: wine on weekends  . Drug use: No  . Sexual activity: Not on file  Lifestyle  . Physical activity:    Days per week: Not on file    Minutes per session: Not on file  . Stress: Not on file  Relationships  . Social connections:    Talks on phone: Not on file     Gets together: Not on file    Attends religious service: Not on file    Active member of club or organization: Not on file    Attends meetings of clubs or organizations: Not on file    Relationship status: Not on file  . Intimate partner violence:    Fear of current or ex partner: Not on file    Emotionally abused: Not on file    Physically abused: Not on file    Forced sexual activity: Not on file  Other Topics Concern  . Not on file  Social History Narrative   Lives at home with his wife.   Left-handed.   2 cups caffeine per day.     PHYSICAL EXAM  Vitals:   11/03/17 1258  BP: 128/82  Pulse: 71  Weight: 244 lb 9.6 oz (110.9 kg)  Height: 6' (1.829 m)   Body mass index is 33.17 kg/m.  Generalized: Well developed, in no acute distress  Head: normocephalic and atraumatic,. Oropharynx benign  Neck: Supple, no carotid bruits  Cardiac: Regular rate rhythm, no murmur  Musculoskeletal: No deformity  Skin edema both ankles L>R Neurological examination   Mentation: Alert  MMSE - Mini Mental State Exam 11/03/2017 05/04/2017 11/03/2016  Not completed: (No Data) - -  Orientation to time 5 5 5   Orientation to Place 5 5 5   Registration 3 3 3   Attention/ Calculation 3 5 5   Recall 2 0 1  Language- name 2 objects 2 2 2   Language- repeat 1 1 1   Language- follow 3 step command 3 3 3   Language- read & follow direction 1 1 1   Write a sentence 1 1 1   Copy design 1 1 1   Total score 27 27 28     Follows all commands speech and language fluent.   Cranial nerve II-XII: Pupils were equal round reactive to light extraocular movements were full, visual field were full on confrontational test. Facial sensation and strength were normal. hearing was intact to finger rubbing bilaterally. Uvula tongue midline. head turning and shoulder shrug were normal and symmetric.Tongue protrusion into cheek strength was normal. Motor: normal bulk and tone, full strength in the BUE, BLE,  Sensory:  normal  and symmetric to light touch, pinprick, and  Vibration, in the upper and lower extremities Coordination: finger-nose-finger, heel-to-shin bilaterally, no dysmetria Reflexes: Brachioradialis 2/2, biceps 2/2, triceps 2/2, patellar 2/2, Achilles 2/2, plantar responses were flexor bilaterally. Gait and Station: Rising up from seated position mildly stooped posture smooth turning no assistive device  DIAGNOSTIC DATA (LABS, IMAGING, TESTING) - I reviewed patient records, labs, notes, testing and imaging myself where available.  Lab Results  Component Value Date   WBC 6.4 02/19/2012   HGB 16.4 02/19/2012   HCT 47.7 02/19/2012   MCV 99.6 02/19/2012   PLT 149 (L) 02/19/2012      Component Value Date/Time   NA 139 02/19/2012 1505   K 4.4 02/19/2012 1505   CL 104 02/19/2012 1505   CO2 26 02/19/2012 1505   GLUCOSE 91 02/19/2012 1505   BUN 22 02/19/2012 1505   CREATININE 1.13 02/19/2012 1505   CALCIUM 8.9 02/19/2012 1505   GFRNONAA 65 (L) 02/19/2012 1505   GFRAA 76 (L) 02/19/2012 1505    Lab Results  Component Value Date   FEOFHQRF75 883 11/03/2016   Lab Results  Component Value Date   TSH 1.970 11/03/2016      ASSESSMENT AND PLAN RANDEE UPCHURCH is a 72 y.o. male  with mild cognitive impairment unable to take Aricept because previous history of atrial fibrillation status post ablation on flecainide.  Recent fall and brief episode of aphasia.    Discussed with Dr. Krista Blue Dr. Krista Blue reviewed recent CT from [facility] no acute changes, evidence of atrophy Recent episode probably TIA, he is already on Pradaxa Prinivil and Pravachol. Continue Namenda 10 mg twice a day Will get EEG to rule out seizure Follow up 6 months Dennie Bible, [facility], [facility], APRN  [facility] [address], [place], [place] [address] [phone]

## 2017-11-03 ENCOUNTER — Encounter: Payer: Self-pay | Admitting: Nurse Practitioner

## 2017-11-03 ENCOUNTER — Ambulatory Visit (INDEPENDENT_AMBULATORY_CARE_PROVIDER_SITE_OTHER): Payer: Medicare Other | Admitting: Nurse Practitioner

## 2017-11-03 VITALS — BP 128/82 | HR 71 | Ht 72.0 in | Wt 244.6 lb

## 2017-11-03 DIAGNOSIS — Z9181 History of falling: Secondary | ICD-10-CM

## 2017-11-03 DIAGNOSIS — R4701 Aphasia: Secondary | ICD-10-CM

## 2017-11-03 DIAGNOSIS — G3184 Mild cognitive impairment, so stated: Secondary | ICD-10-CM | POA: Diagnosis not present

## 2017-11-03 HISTORY — DX: Aphasia: R47.01

## 2017-11-03 HISTORY — DX: History of falling: Z91.81

## 2017-11-03 MED ORDER — MEMANTINE HCL 10 MG PO TABS: 10.0000 mg | ORAL_TABLET | Freq: Two times a day (BID) | ORAL | 3 refills | Status: DC

## 2017-11-03 NOTE — Patient Instructions (Signed)
Continue Namenda 10 mg twice a day Will get EEG Dr. Krista Blue reviewed CT of the brain Follow up 6 months

## 2017-11-04 DIAGNOSIS — R2681 Unsteadiness on feet: Secondary | ICD-10-CM | POA: Diagnosis not present

## 2017-11-04 DIAGNOSIS — M6281 Muscle weakness (generalized): Secondary | ICD-10-CM | POA: Diagnosis not present

## 2017-11-04 DIAGNOSIS — R2689 Other abnormalities of gait and mobility: Secondary | ICD-10-CM | POA: Diagnosis not present

## 2017-11-04 NOTE — Progress Notes (Signed)
I have reviewed and agreed above plan. 

## 2017-11-10 DIAGNOSIS — R2689 Other abnormalities of gait and mobility: Secondary | ICD-10-CM | POA: Diagnosis not present

## 2017-11-10 DIAGNOSIS — M6281 Muscle weakness (generalized): Secondary | ICD-10-CM | POA: Diagnosis not present

## 2017-11-10 DIAGNOSIS — R2681 Unsteadiness on feet: Secondary | ICD-10-CM | POA: Diagnosis not present

## 2017-11-12 DIAGNOSIS — R2681 Unsteadiness on feet: Secondary | ICD-10-CM | POA: Diagnosis not present

## 2017-11-12 DIAGNOSIS — M6281 Muscle weakness (generalized): Secondary | ICD-10-CM | POA: Diagnosis not present

## 2017-11-12 DIAGNOSIS — R2689 Other abnormalities of gait and mobility: Secondary | ICD-10-CM | POA: Diagnosis not present

## 2017-11-23 ENCOUNTER — Ambulatory Visit (INDEPENDENT_AMBULATORY_CARE_PROVIDER_SITE_OTHER): Payer: Medicare Other | Admitting: Neurology

## 2017-11-23 DIAGNOSIS — G3184 Mild cognitive impairment, so stated: Secondary | ICD-10-CM

## 2017-11-23 DIAGNOSIS — R4701 Aphasia: Secondary | ICD-10-CM

## 2017-11-23 DIAGNOSIS — R410 Disorientation, unspecified: Secondary | ICD-10-CM | POA: Diagnosis not present

## 2017-11-23 DIAGNOSIS — L570 Actinic keratosis: Secondary | ICD-10-CM | POA: Diagnosis not present

## 2017-11-23 DIAGNOSIS — L578 Other skin changes due to chronic exposure to nonionizing radiation: Secondary | ICD-10-CM | POA: Diagnosis not present

## 2017-11-23 DIAGNOSIS — Z9181 History of falling: Secondary | ICD-10-CM

## 2017-11-23 DIAGNOSIS — R600 Localized edema: Secondary | ICD-10-CM | POA: Diagnosis not present

## 2017-11-24 ENCOUNTER — Telehealth: Payer: Self-pay | Admitting: *Deleted

## 2017-11-24 DIAGNOSIS — R609 Edema, unspecified: Secondary | ICD-10-CM | POA: Diagnosis not present

## 2017-11-24 DIAGNOSIS — R600 Localized edema: Secondary | ICD-10-CM | POA: Diagnosis not present

## 2017-11-24 NOTE — Telephone Encounter (Signed)
Spoke with patient and informed him that his EEG was normal,  no evidence of seizure activity. Patient had no questions, verbalized understanding, appreciation.

## 2017-12-01 DIAGNOSIS — H0014 Chalazion left upper eyelid: Secondary | ICD-10-CM | POA: Diagnosis not present

## 2018-01-20 DIAGNOSIS — W01118A Fall on same level from slipping, tripping and stumbling with subsequent striking against other sharp object, initial encounter: Secondary | ICD-10-CM | POA: Diagnosis not present

## 2018-01-20 DIAGNOSIS — S51001A Unspecified open wound of right elbow, initial encounter: Secondary | ICD-10-CM | POA: Diagnosis not present

## 2018-01-27 DIAGNOSIS — Z4802 Encounter for removal of sutures: Secondary | ICD-10-CM | POA: Diagnosis not present

## 2018-01-27 DIAGNOSIS — Z5189 Encounter for other specified aftercare: Secondary | ICD-10-CM | POA: Diagnosis not present

## 2018-01-27 DIAGNOSIS — Z6833 Body mass index (BMI) 33.0-33.9, adult: Secondary | ICD-10-CM | POA: Diagnosis not present

## 2018-02-03 DIAGNOSIS — R972 Elevated prostate specific antigen [PSA]: Secondary | ICD-10-CM | POA: Diagnosis not present

## 2018-02-03 DIAGNOSIS — N401 Enlarged prostate with lower urinary tract symptoms: Secondary | ICD-10-CM | POA: Diagnosis not present

## 2018-02-03 DIAGNOSIS — R351 Nocturia: Secondary | ICD-10-CM | POA: Diagnosis not present

## 2018-02-26 DIAGNOSIS — L03114 Cellulitis of left upper limb: Secondary | ICD-10-CM | POA: Diagnosis not present

## 2018-02-26 DIAGNOSIS — M19021 Primary osteoarthritis, right elbow: Secondary | ICD-10-CM | POA: Diagnosis not present

## 2018-02-26 DIAGNOSIS — M7989 Other specified soft tissue disorders: Secondary | ICD-10-CM | POA: Diagnosis not present

## 2018-02-26 DIAGNOSIS — M71121 Other infective bursitis, right elbow: Secondary | ICD-10-CM | POA: Diagnosis not present

## 2018-03-02 DIAGNOSIS — M19012 Primary osteoarthritis, left shoulder: Secondary | ICD-10-CM | POA: Diagnosis not present

## 2018-03-10 DIAGNOSIS — M71121 Other infective bursitis, right elbow: Secondary | ICD-10-CM | POA: Diagnosis not present

## 2018-05-05 ENCOUNTER — Other Ambulatory Visit: Payer: Self-pay

## 2018-05-05 ENCOUNTER — Ambulatory Visit (INDEPENDENT_AMBULATORY_CARE_PROVIDER_SITE_OTHER): Payer: Medicare Other | Admitting: Neurology

## 2018-05-05 ENCOUNTER — Encounter: Payer: Self-pay | Admitting: Neurology

## 2018-05-05 DIAGNOSIS — G3184 Mild cognitive impairment, so stated: Secondary | ICD-10-CM | POA: Diagnosis not present

## 2018-05-05 DIAGNOSIS — R269 Unspecified abnormalities of gait and mobility: Secondary | ICD-10-CM | POA: Insufficient documentation

## 2018-05-05 HISTORY — DX: Unspecified abnormalities of gait and mobility: R26.9

## 2018-05-05 MED ORDER — MEMANTINE HCL 10 MG PO TABS: 10.0000 mg | ORAL_TABLET | Freq: Two times a day (BID) | ORAL | 3 refills | Status: AC

## 2018-05-05 NOTE — Progress Notes (Signed)
[facility]  PATIENT: Mitchell Miles DOB: 1945/10/31  HISTORY OF PRESENT ILLNESS: Mitchell Miles is a 73 years old male, seen in refer by his primary care physician Gerilyn Pilgrim D.for evaluation of memory loss, initial evaluation was on May 04 2017.  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  In 2015, he was noted to have gradually onset memory loss, also personality change, he has lost his professional interest, forgot appointment, forget to document, symptoms gradually become more frequent more obvious, especially since his most recent left arm cellulitis in July 2017 which require hospital admission, he was noted to have increased confusion, he was also noted to feel frustrated easily, become sedentary.  During his hospital admission in July 2018, he had MRI of  brain, laboratory evaluations,  He was started on Namenda 10 mg twice a day since 2017, initially noticed mild improvement.  His paternal grandfather suffered Alzheimer's disease, his father has Parkinson's disease  UPDATE Nov 03 2016: I reviewed the laboratory evaluation on July 31 2016, normal CBC, hemoglobin of 14.5, UA showed 1 plus protein, CMP showed normal glucose 92 creatinine 0.7, troponin was negative, INR was 1.2, blood culture was negative twice,  Left upper extremity venous Doppler no evidence of DVT,   CT angiogram of the chest no acute cardiopulmonary disease, no evidence of pulmonary emboli,  MRI of the brainIn July 2018 showed moderate atrophy, mild chronic microvascular ischemia, this was done at [facility] radiology   CT head without contrast, no acute abnormality, atrophy, small vessel disease  Chest x-ray no acute disease  He is taking namenda 10mg  twice a day,  he had few times of temper outburst, he retired as Pharmacist, community. He could not take aricept due to flecainide.  UPDATE May 04 2017:YY He is accompanied by his wife at today's clinical visit, has retired, was able to exercise few times week, complains of low back pain, no radiating pain, urinary urgency, difficulty initiating urine, is under the care by the urologist, is tolerating Namenda twice a day, cannot take Aricept due to previous history of atrial fibrillation, status post ablation  UPDATE 10/30/2019CM Mr. Mitchell Miles, 73 year old male returns for follow-up company by his wife.  He is here for mild cognitive impairment.  Wife reports he fell in the bathroom on 10/18/2017 did not call to her when she found him he was unable to speak.  Had another fall the next day.  Se[facility] CT of the head was done.  No acute abnormality according to the wife.  Patient has a history of hypertension , atrial fibrillation and hypercholesterolemia.  His mild cognitive impairment is stable.  He is currently on Namenda 10 mg twice daily.  He is unable to take Aricept off status post ablation and history of atrial fib.  He also complains of daytime sleepiness however when asked if he would have a sleep study he states he would not wear CPAP.  He returns for reevaluation.  Virtual Visit via Video  I connected with [name] on 05/05/18 at  by Video and verified that I am speaking with the correct person using two identifiers.   I discussed the limitations, risks, security and privacy concerns of performing an evaluation and management service by video and the availability of in person appointments. I also discussed  with the patient that there may be a patient responsible charge related to this service. The patient expressed understanding and agreed to proceed.  Wife also reported that he has mild gait abnormality.  History of Present Illness: He is with his wife during today's interview, he spends most of  the time watching TV, tolerating Namenda 10 mg twice a day, stable mild memory loss, was diagnosed with depression, taking Prozac 20 mg daily, which has made some difference,  Wife also reported that he has some gait abnormality, he denies significant low back pain, no bowel and bladder incontinence, right leg is weaker, no sensory loss  Observations/Objective: I have reviewed problem lists, medications, allergies.  MMSE - Mini Mental State Exam 05/05/2018 11/03/2017 05/04/2017  Not completed: - (No Data) -  Orientation to time 5 5 5   Orientation to Place 5 5 5   Registration 3 3 3   Attention/ Calculation 2 3 5   Recall 2 2 0  Language- name 2 objects 2 2 2   Language- repeat 1 1 1   Language- follow 3 step command 3 3 3   Language- read & follow direction 1 1 1   Write a sentence  not performed 1 1  Copy design  not performed 1 1  Total score 24/28 27 27    Assessment and Plan: Mild cognitive impairment  Continue namenda 10mg  bid  Could not take Aricept because he is on flecainide, history of first-degree AV block, atrial fibrillation  Encouraged him continue moderate exercise  Gait abnormality  Likely due to deconditioning, obesity, lack of motivation for mood disorder  Follow Up Instructions:  6 months with Judson Roch    I discussed the assessment and treatment plan with the patient. The patient was provided an opportunity to ask questions and all were answered. The patient agreed with the plan and demonstrated an understanding of the instructions.   The patient was advised to call back or seek an in-person evaluation if the symptoms worsen or if the condition fails to improve as anticipated.  I provided 30 minutes of non-face-to-face time during this encounter.   Marcial Pacas, MD

## 2018-05-11 DIAGNOSIS — Z1331 Encounter for screening for depression: Secondary | ICD-10-CM | POA: Diagnosis not present

## 2018-05-11 DIAGNOSIS — Z9181 History of falling: Secondary | ICD-10-CM | POA: Diagnosis not present

## 2018-05-11 DIAGNOSIS — E785 Hyperlipidemia, unspecified: Secondary | ICD-10-CM | POA: Diagnosis not present

## 2018-05-11 DIAGNOSIS — E669 Obesity, unspecified: Secondary | ICD-10-CM | POA: Diagnosis not present

## 2018-05-11 DIAGNOSIS — Z0000 Encounter for general adult medical examination without abnormal findings: Secondary | ICD-10-CM | POA: Diagnosis not present

## 2018-05-11 DIAGNOSIS — Z125 Encounter for screening for malignant neoplasm of prostate: Secondary | ICD-10-CM | POA: Diagnosis not present

## 2018-05-12 DIAGNOSIS — I10 Essential (primary) hypertension: Secondary | ICD-10-CM | POA: Diagnosis not present

## 2018-05-12 DIAGNOSIS — E785 Hyperlipidemia, unspecified: Secondary | ICD-10-CM | POA: Diagnosis not present

## 2018-05-12 DIAGNOSIS — I4891 Unspecified atrial fibrillation: Secondary | ICD-10-CM | POA: Diagnosis not present

## 2018-05-12 DIAGNOSIS — F0390 Unspecified dementia without behavioral disturbance: Secondary | ICD-10-CM | POA: Diagnosis not present

## 2018-05-19 DIAGNOSIS — L820 Inflamed seborrheic keratosis: Secondary | ICD-10-CM | POA: Diagnosis not present

## 2018-05-19 DIAGNOSIS — L570 Actinic keratosis: Secondary | ICD-10-CM | POA: Diagnosis not present

## 2018-05-19 DIAGNOSIS — L578 Other skin changes due to chronic exposure to nonionizing radiation: Secondary | ICD-10-CM | POA: Diagnosis not present

## 2018-05-19 DIAGNOSIS — L821 Other seborrheic keratosis: Secondary | ICD-10-CM | POA: Diagnosis not present

## 2018-08-09 DIAGNOSIS — H26492 Other secondary cataract, left eye: Secondary | ICD-10-CM | POA: Diagnosis not present

## 2018-08-09 DIAGNOSIS — H18893 Other specified disorders of cornea, bilateral: Secondary | ICD-10-CM | POA: Diagnosis not present

## 2018-08-09 DIAGNOSIS — Z961 Presence of intraocular lens: Secondary | ICD-10-CM | POA: Diagnosis not present

## 2018-10-10 DIAGNOSIS — Z79899 Other long term (current) drug therapy: Secondary | ICD-10-CM | POA: Diagnosis not present

## 2018-10-10 DIAGNOSIS — F0390 Unspecified dementia without behavioral disturbance: Secondary | ICD-10-CM | POA: Diagnosis not present

## 2018-10-18 DIAGNOSIS — Z23 Encounter for immunization: Secondary | ICD-10-CM | POA: Diagnosis not present

## 2018-11-15 DIAGNOSIS — E785 Hyperlipidemia, unspecified: Secondary | ICD-10-CM | POA: Diagnosis not present

## 2018-11-15 DIAGNOSIS — Z139 Encounter for screening, unspecified: Secondary | ICD-10-CM | POA: Diagnosis not present

## 2018-11-15 DIAGNOSIS — I440 Atrioventricular block, first degree: Secondary | ICD-10-CM | POA: Diagnosis not present

## 2018-11-15 DIAGNOSIS — F0390 Unspecified dementia without behavioral disturbance: Secondary | ICD-10-CM | POA: Diagnosis not present

## 2018-11-15 DIAGNOSIS — I10 Essential (primary) hypertension: Secondary | ICD-10-CM | POA: Diagnosis not present

## 2018-11-15 DIAGNOSIS — K219 Gastro-esophageal reflux disease without esophagitis: Secondary | ICD-10-CM | POA: Diagnosis not present

## 2018-11-21 DIAGNOSIS — L570 Actinic keratosis: Secondary | ICD-10-CM | POA: Diagnosis not present

## 2018-11-21 DIAGNOSIS — L578 Other skin changes due to chronic exposure to nonionizing radiation: Secondary | ICD-10-CM | POA: Diagnosis not present

## 2018-12-06 NOTE — Telephone Encounter (Signed)
Done

## 2018-12-07 ENCOUNTER — Telehealth: Payer: Self-pay | Admitting: Neurology

## 2018-12-07 NOTE — Telephone Encounter (Signed)
Open in error

## 2019-01-16 DIAGNOSIS — R2681 Unsteadiness on feet: Secondary | ICD-10-CM | POA: Diagnosis not present

## 2019-01-16 DIAGNOSIS — G3184 Mild cognitive impairment, so stated: Secondary | ICD-10-CM | POA: Diagnosis not present

## 2019-01-16 DIAGNOSIS — F0390 Unspecified dementia without behavioral disturbance: Secondary | ICD-10-CM | POA: Diagnosis not present

## 2019-01-19 DIAGNOSIS — M6281 Muscle weakness (generalized): Secondary | ICD-10-CM | POA: Diagnosis not present

## 2019-01-19 DIAGNOSIS — R2689 Other abnormalities of gait and mobility: Secondary | ICD-10-CM | POA: Diagnosis not present

## 2019-01-23 DIAGNOSIS — M6281 Muscle weakness (generalized): Secondary | ICD-10-CM | POA: Diagnosis not present

## 2019-01-23 DIAGNOSIS — R2689 Other abnormalities of gait and mobility: Secondary | ICD-10-CM | POA: Diagnosis not present

## 2019-01-25 DIAGNOSIS — R2689 Other abnormalities of gait and mobility: Secondary | ICD-10-CM | POA: Diagnosis not present

## 2019-01-25 DIAGNOSIS — M6281 Muscle weakness (generalized): Secondary | ICD-10-CM | POA: Diagnosis not present

## 2019-01-30 DIAGNOSIS — R2689 Other abnormalities of gait and mobility: Secondary | ICD-10-CM | POA: Diagnosis not present

## 2019-01-30 DIAGNOSIS — M6281 Muscle weakness (generalized): Secondary | ICD-10-CM | POA: Diagnosis not present

## 2019-02-01 DIAGNOSIS — M6281 Muscle weakness (generalized): Secondary | ICD-10-CM | POA: Diagnosis not present

## 2019-02-01 DIAGNOSIS — R2689 Other abnormalities of gait and mobility: Secondary | ICD-10-CM | POA: Diagnosis not present

## 2019-02-07 DIAGNOSIS — R2689 Other abnormalities of gait and mobility: Secondary | ICD-10-CM | POA: Diagnosis not present

## 2019-02-07 DIAGNOSIS — M6281 Muscle weakness (generalized): Secondary | ICD-10-CM | POA: Diagnosis not present

## 2019-02-13 DIAGNOSIS — D696 Thrombocytopenia, unspecified: Secondary | ICD-10-CM | POA: Diagnosis not present

## 2019-02-14 DIAGNOSIS — R2689 Other abnormalities of gait and mobility: Secondary | ICD-10-CM | POA: Diagnosis not present

## 2019-02-14 DIAGNOSIS — M6281 Muscle weakness (generalized): Secondary | ICD-10-CM | POA: Diagnosis not present

## 2019-02-15 DIAGNOSIS — R972 Elevated prostate specific antigen [PSA]: Secondary | ICD-10-CM | POA: Diagnosis not present

## 2019-02-21 DIAGNOSIS — M6281 Muscle weakness (generalized): Secondary | ICD-10-CM | POA: Diagnosis not present

## 2019-02-21 DIAGNOSIS — R2689 Other abnormalities of gait and mobility: Secondary | ICD-10-CM | POA: Diagnosis not present

## 2019-02-27 DIAGNOSIS — Z01818 Encounter for other preprocedural examination: Secondary | ICD-10-CM | POA: Diagnosis not present

## 2019-02-27 DIAGNOSIS — K5730 Diverticulosis of large intestine without perforation or abscess without bleeding: Secondary | ICD-10-CM | POA: Diagnosis not present

## 2019-02-27 DIAGNOSIS — Z800 Family history of malignant neoplasm of digestive organs: Secondary | ICD-10-CM | POA: Diagnosis not present

## 2019-03-08 DIAGNOSIS — Z20828 Contact with and (suspected) exposure to other viral communicable diseases: Secondary | ICD-10-CM | POA: Diagnosis not present

## 2019-03-08 DIAGNOSIS — Z20822 Contact with and (suspected) exposure to covid-19: Secondary | ICD-10-CM | POA: Diagnosis not present

## 2019-03-14 DIAGNOSIS — Z7902 Long term (current) use of antithrombotics/antiplatelets: Secondary | ICD-10-CM | POA: Diagnosis not present

## 2019-03-14 DIAGNOSIS — I10 Essential (primary) hypertension: Secondary | ICD-10-CM | POA: Diagnosis not present

## 2019-03-14 DIAGNOSIS — Z86010 Personal history of colonic polyps: Secondary | ICD-10-CM | POA: Diagnosis not present

## 2019-03-14 DIAGNOSIS — K5730 Diverticulosis of large intestine without perforation or abscess without bleeding: Secondary | ICD-10-CM | POA: Diagnosis not present

## 2019-03-14 DIAGNOSIS — Z09 Encounter for follow-up examination after completed treatment for conditions other than malignant neoplasm: Secondary | ICD-10-CM | POA: Diagnosis not present

## 2019-03-14 DIAGNOSIS — I4891 Unspecified atrial fibrillation: Secondary | ICD-10-CM | POA: Diagnosis not present

## 2019-03-14 DIAGNOSIS — Z79899 Other long term (current) drug therapy: Secondary | ICD-10-CM | POA: Diagnosis not present

## 2019-03-14 DIAGNOSIS — Z1211 Encounter for screening for malignant neoplasm of colon: Secondary | ICD-10-CM | POA: Diagnosis not present

## 2019-03-15 DIAGNOSIS — E785 Hyperlipidemia, unspecified: Secondary | ICD-10-CM | POA: Diagnosis not present

## 2019-03-15 DIAGNOSIS — I440 Atrioventricular block, first degree: Secondary | ICD-10-CM | POA: Diagnosis not present

## 2019-03-15 DIAGNOSIS — I10 Essential (primary) hypertension: Secondary | ICD-10-CM | POA: Diagnosis not present

## 2019-03-15 DIAGNOSIS — F0390 Unspecified dementia without behavioral disturbance: Secondary | ICD-10-CM | POA: Diagnosis not present

## 2019-04-03 DIAGNOSIS — G3184 Mild cognitive impairment, so stated: Secondary | ICD-10-CM | POA: Diagnosis not present

## 2019-04-03 DIAGNOSIS — R130 Aphagia: Secondary | ICD-10-CM | POA: Diagnosis not present

## 2019-04-03 DIAGNOSIS — Z6833 Body mass index (BMI) 33.0-33.9, adult: Secondary | ICD-10-CM | POA: Diagnosis not present

## 2019-04-05 DIAGNOSIS — J309 Allergic rhinitis, unspecified: Secondary | ICD-10-CM | POA: Diagnosis not present

## 2019-04-05 DIAGNOSIS — Z6833 Body mass index (BMI) 33.0-33.9, adult: Secondary | ICD-10-CM | POA: Diagnosis not present

## 2019-04-19 DIAGNOSIS — S80812A Abrasion, left lower leg, initial encounter: Secondary | ICD-10-CM | POA: Diagnosis not present

## 2019-05-16 DIAGNOSIS — Z6835 Body mass index (BMI) 35.0-35.9, adult: Secondary | ICD-10-CM | POA: Diagnosis not present

## 2019-05-16 DIAGNOSIS — Z125 Encounter for screening for malignant neoplasm of prostate: Secondary | ICD-10-CM | POA: Diagnosis not present

## 2019-05-16 DIAGNOSIS — Z9181 History of falling: Secondary | ICD-10-CM | POA: Diagnosis not present

## 2019-05-16 DIAGNOSIS — Z1331 Encounter for screening for depression: Secondary | ICD-10-CM | POA: Diagnosis not present

## 2019-05-16 DIAGNOSIS — Z0000 Encounter for general adult medical examination without abnormal findings: Secondary | ICD-10-CM | POA: Diagnosis not present

## 2019-05-16 DIAGNOSIS — E785 Hyperlipidemia, unspecified: Secondary | ICD-10-CM | POA: Diagnosis not present

## 2019-05-22 DIAGNOSIS — L570 Actinic keratosis: Secondary | ICD-10-CM | POA: Diagnosis not present

## 2019-05-22 DIAGNOSIS — R600 Localized edema: Secondary | ICD-10-CM | POA: Diagnosis not present

## 2019-05-22 DIAGNOSIS — L97921 Non-pressure chronic ulcer of unspecified part of left lower leg limited to breakdown of skin: Secondary | ICD-10-CM | POA: Diagnosis not present

## 2019-05-22 DIAGNOSIS — L821 Other seborrheic keratosis: Secondary | ICD-10-CM | POA: Diagnosis not present

## 2019-05-22 DIAGNOSIS — L578 Other skin changes due to chronic exposure to nonionizing radiation: Secondary | ICD-10-CM | POA: Diagnosis not present

## 2019-05-29 DIAGNOSIS — L97921 Non-pressure chronic ulcer of unspecified part of left lower leg limited to breakdown of skin: Secondary | ICD-10-CM | POA: Diagnosis not present

## 2019-05-29 DIAGNOSIS — R600 Localized edema: Secondary | ICD-10-CM | POA: Diagnosis not present

## 2019-06-08 DIAGNOSIS — L97921 Non-pressure chronic ulcer of unspecified part of left lower leg limited to breakdown of skin: Secondary | ICD-10-CM | POA: Diagnosis not present

## 2019-08-24 DIAGNOSIS — K219 Gastro-esophageal reflux disease without esophagitis: Secondary | ICD-10-CM | POA: Diagnosis not present

## 2019-08-24 DIAGNOSIS — Z6835 Body mass index (BMI) 35.0-35.9, adult: Secondary | ICD-10-CM | POA: Diagnosis not present

## 2019-08-24 DIAGNOSIS — I10 Essential (primary) hypertension: Secondary | ICD-10-CM | POA: Diagnosis not present

## 2019-08-24 DIAGNOSIS — F0390 Unspecified dementia without behavioral disturbance: Secondary | ICD-10-CM | POA: Diagnosis not present

## 2019-08-24 DIAGNOSIS — Z79899 Other long term (current) drug therapy: Secondary | ICD-10-CM | POA: Diagnosis not present

## 2019-08-24 DIAGNOSIS — I4891 Unspecified atrial fibrillation: Secondary | ICD-10-CM | POA: Diagnosis not present

## 2019-08-24 DIAGNOSIS — F339 Major depressive disorder, recurrent, unspecified: Secondary | ICD-10-CM | POA: Diagnosis not present

## 2019-08-24 DIAGNOSIS — I440 Atrioventricular block, first degree: Secondary | ICD-10-CM | POA: Diagnosis not present

## 2019-08-24 DIAGNOSIS — E785 Hyperlipidemia, unspecified: Secondary | ICD-10-CM | POA: Diagnosis not present

## 2019-08-31 DIAGNOSIS — L821 Other seborrheic keratosis: Secondary | ICD-10-CM | POA: Diagnosis not present

## 2019-08-31 DIAGNOSIS — L578 Other skin changes due to chronic exposure to nonionizing radiation: Secondary | ICD-10-CM | POA: Diagnosis not present

## 2019-08-31 DIAGNOSIS — L570 Actinic keratosis: Secondary | ICD-10-CM | POA: Diagnosis not present

## 2019-08-31 DIAGNOSIS — R600 Localized edema: Secondary | ICD-10-CM | POA: Diagnosis not present

## 2019-08-31 DIAGNOSIS — L820 Inflamed seborrheic keratosis: Secondary | ICD-10-CM | POA: Diagnosis not present

## 2019-09-21 DIAGNOSIS — R5383 Other fatigue: Secondary | ICD-10-CM | POA: Diagnosis not present

## 2019-09-21 DIAGNOSIS — G2581 Restless legs syndrome: Secondary | ICD-10-CM | POA: Diagnosis not present

## 2019-09-21 DIAGNOSIS — G3184 Mild cognitive impairment, so stated: Secondary | ICD-10-CM | POA: Diagnosis not present

## 2019-09-21 DIAGNOSIS — G479 Sleep disorder, unspecified: Secondary | ICD-10-CM | POA: Diagnosis not present

## 2019-09-21 DIAGNOSIS — R0683 Snoring: Secondary | ICD-10-CM | POA: Diagnosis not present

## 2019-10-02 DIAGNOSIS — Z23 Encounter for immunization: Secondary | ICD-10-CM | POA: Diagnosis not present

## 2019-10-04 DIAGNOSIS — R531 Weakness: Secondary | ICD-10-CM | POA: Diagnosis not present

## 2019-10-04 DIAGNOSIS — G319 Degenerative disease of nervous system, unspecified: Secondary | ICD-10-CM | POA: Diagnosis not present

## 2019-10-04 DIAGNOSIS — T50B95A Adverse effect of other viral vaccines, initial encounter: Secondary | ICD-10-CM | POA: Diagnosis not present

## 2019-10-04 DIAGNOSIS — R918 Other nonspecific abnormal finding of lung field: Secondary | ICD-10-CM | POA: Diagnosis not present

## 2019-10-04 DIAGNOSIS — I6782 Cerebral ischemia: Secondary | ICD-10-CM | POA: Diagnosis not present

## 2019-10-04 DIAGNOSIS — R0902 Hypoxemia: Secondary | ICD-10-CM | POA: Diagnosis not present

## 2019-10-04 DIAGNOSIS — T50Z95A Adverse effect of other vaccines and biological substances, initial encounter: Secondary | ICD-10-CM | POA: Diagnosis not present

## 2019-10-04 DIAGNOSIS — G9389 Other specified disorders of brain: Secondary | ICD-10-CM | POA: Diagnosis not present

## 2019-10-11 DIAGNOSIS — R9389 Abnormal findings on diagnostic imaging of other specified body structures: Secondary | ICD-10-CM | POA: Diagnosis not present

## 2019-10-11 DIAGNOSIS — Z6834 Body mass index (BMI) 34.0-34.9, adult: Secondary | ICD-10-CM | POA: Diagnosis not present

## 2019-10-11 DIAGNOSIS — T50Z95S Adverse effect of other vaccines and biological substances, sequela: Secondary | ICD-10-CM | POA: Diagnosis not present

## 2019-10-18 DIAGNOSIS — R918 Other nonspecific abnormal finding of lung field: Secondary | ICD-10-CM | POA: Diagnosis not present

## 2019-10-18 DIAGNOSIS — J984 Other disorders of lung: Secondary | ICD-10-CM | POA: Diagnosis not present

## 2019-10-18 DIAGNOSIS — I728 Aneurysm of other specified arteries: Secondary | ICD-10-CM | POA: Diagnosis not present

## 2019-10-18 DIAGNOSIS — I700 Atherosclerosis of aorta: Secondary | ICD-10-CM | POA: Diagnosis not present

## 2019-10-18 DIAGNOSIS — R9389 Abnormal findings on diagnostic imaging of other specified body structures: Secondary | ICD-10-CM | POA: Diagnosis not present

## 2019-10-25 DIAGNOSIS — J310 Chronic rhinitis: Secondary | ICD-10-CM | POA: Diagnosis not present

## 2019-10-30 DIAGNOSIS — Z23 Encounter for immunization: Secondary | ICD-10-CM | POA: Diagnosis not present

## 2019-11-14 DIAGNOSIS — N3000 Acute cystitis without hematuria: Secondary | ICD-10-CM | POA: Diagnosis not present

## 2019-11-14 DIAGNOSIS — N3942 Incontinence without sensory awareness: Secondary | ICD-10-CM | POA: Diagnosis not present

## 2019-12-05 DIAGNOSIS — N3000 Acute cystitis without hematuria: Secondary | ICD-10-CM | POA: Diagnosis not present

## 2019-12-05 DIAGNOSIS — N3941 Urge incontinence: Secondary | ICD-10-CM | POA: Diagnosis not present

## 2019-12-05 DIAGNOSIS — N3942 Incontinence without sensory awareness: Secondary | ICD-10-CM | POA: Diagnosis not present

## 2019-12-12 DIAGNOSIS — R350 Frequency of micturition: Secondary | ICD-10-CM | POA: Diagnosis not present

## 2019-12-19 DIAGNOSIS — R350 Frequency of micturition: Secondary | ICD-10-CM | POA: Diagnosis not present

## 2019-12-22 DIAGNOSIS — R350 Frequency of micturition: Secondary | ICD-10-CM | POA: Diagnosis not present

## 2019-12-26 DIAGNOSIS — I4891 Unspecified atrial fibrillation: Secondary | ICD-10-CM | POA: Diagnosis not present

## 2019-12-26 DIAGNOSIS — E785 Hyperlipidemia, unspecified: Secondary | ICD-10-CM | POA: Diagnosis not present

## 2019-12-26 DIAGNOSIS — I10 Essential (primary) hypertension: Secondary | ICD-10-CM | POA: Diagnosis not present

## 2019-12-26 DIAGNOSIS — F339 Major depressive disorder, recurrent, unspecified: Secondary | ICD-10-CM | POA: Diagnosis not present

## 2019-12-26 DIAGNOSIS — Z6834 Body mass index (BMI) 34.0-34.9, adult: Secondary | ICD-10-CM | POA: Diagnosis not present

## 2019-12-26 DIAGNOSIS — K219 Gastro-esophageal reflux disease without esophagitis: Secondary | ICD-10-CM | POA: Diagnosis not present

## 2019-12-26 DIAGNOSIS — J320 Chronic maxillary sinusitis: Secondary | ICD-10-CM | POA: Diagnosis not present

## 2019-12-26 DIAGNOSIS — Z79899 Other long term (current) drug therapy: Secondary | ICD-10-CM | POA: Diagnosis not present

## 2019-12-26 DIAGNOSIS — Z139 Encounter for screening, unspecified: Secondary | ICD-10-CM | POA: Diagnosis not present

## 2019-12-26 DIAGNOSIS — F0390 Unspecified dementia without behavioral disturbance: Secondary | ICD-10-CM | POA: Diagnosis not present

## 2019-12-26 DIAGNOSIS — I440 Atrioventricular block, first degree: Secondary | ICD-10-CM | POA: Diagnosis not present

## 2020-01-09 DIAGNOSIS — R350 Frequency of micturition: Secondary | ICD-10-CM | POA: Diagnosis not present

## 2020-01-11 DIAGNOSIS — Z20822 Contact with and (suspected) exposure to covid-19: Secondary | ICD-10-CM | POA: Diagnosis not present

## 2020-01-16 DIAGNOSIS — R350 Frequency of micturition: Secondary | ICD-10-CM | POA: Diagnosis not present

## 2020-01-23 DIAGNOSIS — R350 Frequency of micturition: Secondary | ICD-10-CM | POA: Diagnosis not present

## 2020-01-30 DIAGNOSIS — R350 Frequency of micturition: Secondary | ICD-10-CM | POA: Diagnosis not present

## 2020-02-06 DIAGNOSIS — R350 Frequency of micturition: Secondary | ICD-10-CM | POA: Diagnosis not present

## 2020-02-13 DIAGNOSIS — R350 Frequency of micturition: Secondary | ICD-10-CM | POA: Diagnosis not present

## 2020-02-16 DIAGNOSIS — Z03818 Encounter for observation for suspected exposure to other biological agents ruled out: Secondary | ICD-10-CM | POA: Diagnosis not present

## 2020-02-16 DIAGNOSIS — Z20822 Contact with and (suspected) exposure to covid-19: Secondary | ICD-10-CM | POA: Diagnosis not present

## 2020-02-20 DIAGNOSIS — R350 Frequency of micturition: Secondary | ICD-10-CM | POA: Diagnosis not present

## 2020-02-27 DIAGNOSIS — R350 Frequency of micturition: Secondary | ICD-10-CM | POA: Diagnosis not present

## 2020-03-01 DIAGNOSIS — H2513 Age-related nuclear cataract, bilateral: Secondary | ICD-10-CM | POA: Diagnosis not present

## 2020-03-05 DIAGNOSIS — R3915 Urgency of urination: Secondary | ICD-10-CM | POA: Diagnosis not present

## 2020-03-05 DIAGNOSIS — R351 Nocturia: Secondary | ICD-10-CM | POA: Diagnosis not present

## 2020-03-05 DIAGNOSIS — N3942 Incontinence without sensory awareness: Secondary | ICD-10-CM | POA: Diagnosis not present

## 2020-03-06 DIAGNOSIS — I4891 Unspecified atrial fibrillation: Secondary | ICD-10-CM | POA: Diagnosis not present

## 2020-03-06 DIAGNOSIS — Z79899 Other long term (current) drug therapy: Secondary | ICD-10-CM | POA: Diagnosis not present

## 2020-03-06 DIAGNOSIS — F0390 Unspecified dementia without behavioral disturbance: Secondary | ICD-10-CM | POA: Diagnosis not present

## 2020-03-06 DIAGNOSIS — K219 Gastro-esophageal reflux disease without esophagitis: Secondary | ICD-10-CM | POA: Diagnosis not present

## 2020-03-06 DIAGNOSIS — R739 Hyperglycemia, unspecified: Secondary | ICD-10-CM | POA: Diagnosis not present

## 2020-03-06 DIAGNOSIS — I10 Essential (primary) hypertension: Secondary | ICD-10-CM | POA: Diagnosis not present

## 2020-03-06 DIAGNOSIS — F339 Major depressive disorder, recurrent, unspecified: Secondary | ICD-10-CM | POA: Diagnosis not present

## 2020-03-06 DIAGNOSIS — E785 Hyperlipidemia, unspecified: Secondary | ICD-10-CM | POA: Diagnosis not present

## 2020-03-06 DIAGNOSIS — I440 Atrioventricular block, first degree: Secondary | ICD-10-CM | POA: Diagnosis not present

## 2020-03-06 DIAGNOSIS — Z6834 Body mass index (BMI) 34.0-34.9, adult: Secondary | ICD-10-CM | POA: Diagnosis not present

## 2020-03-12 ENCOUNTER — Encounter: Payer: Self-pay | Admitting: *Deleted

## 2020-03-12 ENCOUNTER — Encounter: Payer: Self-pay | Admitting: Cardiology

## 2020-03-19 DIAGNOSIS — R404 Transient alteration of awareness: Secondary | ICD-10-CM | POA: Diagnosis not present

## 2020-03-19 DIAGNOSIS — G4700 Insomnia, unspecified: Secondary | ICD-10-CM | POA: Diagnosis not present

## 2020-03-19 DIAGNOSIS — G3184 Mild cognitive impairment, so stated: Secondary | ICD-10-CM | POA: Diagnosis not present

## 2020-03-19 DIAGNOSIS — R0683 Snoring: Secondary | ICD-10-CM | POA: Diagnosis not present

## 2020-03-19 DIAGNOSIS — N3944 Nocturnal enuresis: Secondary | ICD-10-CM | POA: Diagnosis not present

## 2020-03-19 DIAGNOSIS — G4752 REM sleep behavior disorder: Secondary | ICD-10-CM | POA: Diagnosis not present

## 2020-03-20 DIAGNOSIS — N3942 Incontinence without sensory awareness: Secondary | ICD-10-CM | POA: Diagnosis not present

## 2020-03-20 DIAGNOSIS — R972 Elevated prostate specific antigen [PSA]: Secondary | ICD-10-CM | POA: Diagnosis not present

## 2020-03-20 DIAGNOSIS — N401 Enlarged prostate with lower urinary tract symptoms: Secondary | ICD-10-CM | POA: Diagnosis not present

## 2020-03-26 DIAGNOSIS — R350 Frequency of micturition: Secondary | ICD-10-CM | POA: Diagnosis not present

## 2020-04-01 DIAGNOSIS — L565 Disseminated superficial actinic porokeratosis (DSAP): Secondary | ICD-10-CM | POA: Insufficient documentation

## 2020-04-01 DIAGNOSIS — M1990 Unspecified osteoarthritis, unspecified site: Secondary | ICD-10-CM | POA: Insufficient documentation

## 2020-04-01 DIAGNOSIS — F339 Major depressive disorder, recurrent, unspecified: Secondary | ICD-10-CM | POA: Insufficient documentation

## 2020-04-01 DIAGNOSIS — D696 Thrombocytopenia, unspecified: Secondary | ICD-10-CM | POA: Insufficient documentation

## 2020-04-01 DIAGNOSIS — I440 Atrioventricular block, first degree: Secondary | ICD-10-CM | POA: Insufficient documentation

## 2020-04-01 DIAGNOSIS — E785 Hyperlipidemia, unspecified: Secondary | ICD-10-CM | POA: Insufficient documentation

## 2020-04-01 DIAGNOSIS — K219 Gastro-esophageal reflux disease without esophagitis: Secondary | ICD-10-CM | POA: Insufficient documentation

## 2020-04-01 DIAGNOSIS — R413 Other amnesia: Secondary | ICD-10-CM | POA: Insufficient documentation

## 2020-04-01 DIAGNOSIS — H9190 Unspecified hearing loss, unspecified ear: Secondary | ICD-10-CM | POA: Insufficient documentation

## 2020-04-01 DIAGNOSIS — I10 Essential (primary) hypertension: Secondary | ICD-10-CM | POA: Insufficient documentation

## 2020-04-01 DIAGNOSIS — F0390 Unspecified dementia without behavioral disturbance: Secondary | ICD-10-CM | POA: Insufficient documentation

## 2020-04-01 DIAGNOSIS — I499 Cardiac arrhythmia, unspecified: Secondary | ICD-10-CM | POA: Insufficient documentation

## 2020-04-02 ENCOUNTER — Ambulatory Visit (INDEPENDENT_AMBULATORY_CARE_PROVIDER_SITE_OTHER): Payer: Medicare Other | Admitting: Cardiology

## 2020-04-02 ENCOUNTER — Encounter: Payer: Self-pay | Admitting: Cardiology

## 2020-04-02 ENCOUNTER — Other Ambulatory Visit: Payer: Self-pay

## 2020-04-02 VITALS — BP 130/80 | HR 92 | Ht 72.0 in | Wt 252.4 lb

## 2020-04-02 DIAGNOSIS — E782 Mixed hyperlipidemia: Secondary | ICD-10-CM | POA: Diagnosis not present

## 2020-04-02 DIAGNOSIS — I10 Essential (primary) hypertension: Secondary | ICD-10-CM

## 2020-04-02 DIAGNOSIS — I4891 Unspecified atrial fibrillation: Secondary | ICD-10-CM | POA: Diagnosis not present

## 2020-04-02 DIAGNOSIS — Z7901 Long term (current) use of anticoagulants: Secondary | ICD-10-CM | POA: Diagnosis not present

## 2020-04-02 DIAGNOSIS — I480 Paroxysmal atrial fibrillation: Secondary | ICD-10-CM

## 2020-04-02 HISTORY — DX: Paroxysmal atrial fibrillation: I48.0

## 2020-04-02 NOTE — Patient Instructions (Signed)
Medication Instructions:  No medication changes. *If you need a refill on your cardiac medications before your next appointment, please call your pharmacy*   Lab Work: None ordered If you have labs (blood work) drawn today and your tests are completely normal, you will receive your results only by: Marland Kitchen MyChart Message (if you have MyChart) OR . A paper copy in the mail If you have any lab test that is abnormal or we need to change your treatment, we will call you to review the results.   Testing/Procedures: Your physician has requested that you have an echocardiogram. Echocardiography is a painless test that uses sound waves to create images of your heart. It provides your doctor with information about the size and shape of your heart and how well your heart's chambers and valves are working. This procedure takes approximately one hour. There are no restrictions for this procedure.  Your physician has requested that you have a lexiscan myoview. For further information please visit [email or web address]. Please follow instruction sheet, as given.  The test will take approximately 3 to 4 hours to complete; you may bring reading material.  If someone comes with you to your appointment, they will need to remain in the main lobby due to limited space in the testing area. **If you are pregnant or breastfeeding, please notify the nuclear lab prior to your appointment**  How to prepare for your Myocardial Perfusion Test: . Do not eat or drink 3 hours prior to your test, except you may have water. . Do not consume products containing caffeine (regular or decaffeinated) 12 hours prior to your test. (ex: coffee, chocolate, sodas, tea). . Do bring a list of your current medications with you.  If not listed below, you may take your medications as normal. . Do wear comfortable clothes (no dresses or overalls) and walking shoes, tennis shoes preferred (No heels or open toe shoes are allowed). . Do NOT wear  cologne, perfume, aftershave, or lotions (deodorant is allowed). . If these instructions are not followed, your test will have to be rescheduled.    Follow-Up: At [facility], you and your health needs are our priority.  As part of our continuing mission to provide you with exceptional heart care, we have created designated Provider Care Teams.  These Care Teams include your primary Cardiologist (physician) and Advanced Practice Providers (APPs -  Physician Assistants and Nurse Practitioners) who all work together to provide you with the care you need, when you need it.  We recommend signing up for the patient portal called "MyChart".  Sign up information is provided on this After Visit Summary.  MyChart is used to connect with patients for Virtual Visits (Telemedicine).  Patients are able to view lab/test results, encounter notes, upcoming appointments, etc.  Non-urgent messages can be sent to your provider as well.   To learn more about what you can do with MyChart, go to [email or web address].    Your next appointment:   2 month(s)  The format for your next appointment:   In Person  Provider:   Jyl Heinz, MD   Other Instructions  Cardiac Nuclear Scan  A cardiac nuclear scan is a test that is done to check the flow of blood to your heart. It is done when you are resting and when you are exercising. The test looks for problems such as:  Not enough blood reaching a portion of the heart.  The heart muscle not working as it should. You may need this test if:  You have heart disease.  You have had lab results that are not normal.  You have had heart surgery or a balloon procedure to open up blocked arteries (angioplasty).  You have chest pain.  You have shortness of breath. In this test, a special dye (tracer) is put into your bloodstream. The tracer will travel to your heart. A camera will then take pictures of your heart to see how the tracer moves through your  heart. This test is usually done at a hospital and takes 2-4 hours. Tell a doctor about:  Any allergies you have.  All medicines you are taking, including vitamins, herbs, eye drops, creams, and over-the-counter medicines.  Any problems you or family members have had with anesthetic medicines.  Any blood disorders you have.  Any surgeries you have had.  Any medical conditions you have.  Whether you are pregnant or may be pregnant. What are the risks? Generally, this is a safe test. However, problems may occur, such as:  Serious chest pain and heart attack. This is only a risk if the stress portion of the test is done.  Rapid heartbeat.  A feeling of warmth in your chest. This feeling usually does not last long.  Allergic reaction to the tracer. What happens before the test?  Ask your doctor about changing or stopping your normal medicines. This is important.  Follow instructions from your doctor about what you cannot eat or drink.  Remove your jewelry on the day of the test. What happens during the test? 1. An IV tube will be inserted into one of your veins. 2. Your doctor will give you a small amount of tracer through the IV tube. 3. You will wait for 20-40 minutes while the tracer moves through your bloodstream. 4. Your heart will be monitored with an electrocardiogram (ECG). 5. You will lie down on an exam table. 6. Pictures of your heart will be taken for about 15-20 minutes. 7. You may also have a stress test. For this test, one of these things may be done: ? You will be asked to exercise on a treadmill or a stationary bike. ? You will be given medicines that will make your heart work harder. This is done if you are unable to exercise. 8. When blood flow to your heart has peaked, a tracer will again be given through the IV tube. 9. After 20-40 minutes, you will get back on the exam table. More pictures will be taken of your heart. 10. Depending on the tracer that is  used, more pictures may need to be taken 3-4 hours later. 11. Your IV tube will be removed when the test is over. The test may vary among doctors and hospitals. What happens after the test? 1. Ask your doctor: ? Whether you can return to your normal schedule, including diet, activities, and medicines. ? Whether you should drink more fluids. This will help to remove the tracer from your body. Drink enough fluid to keep your pee (urine) pale yellow. 2. Ask your doctor, or the department that is doing the test: ? When will my results be ready? ? How will I get my results? Summary  A cardiac nuclear scan is a test that is done to check the flow of blood to your heart.  Tell your doctor whether you are pregnant or may be pregnant.  Before the test, ask your doctor about changing or stopping your normal medicines. This is important.  Ask your doctor whether you can return  to your normal activities. You may be asked to drink more fluids. This information is not intended to replace advice given to you by your health care provider. Make sure you discuss any questions you have with your health care provider. Document Revised: 04/13/2018 Document Reviewed: 06/07/2017 Elsevier Patient Education  [place].  Echocardiogram An echocardiogram is a procedure that uses painless sound waves (ultrasound) to produce an image of the heart. Images from an echocardiogram can provide important information about:  Signs of coronary artery disease (CAD).  Aneurysm detection. An aneurysm is a weak or damaged part of an artery wall that bulges out from the normal force of blood pumping through the body.  Heart size and shape. Changes in the size or shape of the heart can be associated with certain conditions, including heart failure, aneurysm, and CAD.  Heart muscle function.  Heart valve function.  Signs of a past heart attack.  Fluid buildup around the heart.  Thickening of the heart  muscle.  A tumor or infectious growth around the heart valves. Tell a health care provider about:  Any allergies you have.  All medicines you are taking, including vitamins, herbs, eye drops, creams, and over-the-counter medicines.  Any blood disorders you have.  Any surgeries you have had.  Any medical conditions you have.  Whether you are pregnant or may be pregnant. What are the risks? Generally, this is a safe procedure. However, problems may occur, including:  Allergic reaction to dye (contrast) that may be used during the procedure. What happens before the procedure? No specific preparation is needed. You may eat and drink normally. What happens during the procedure?    An IV tube may be inserted into one of your veins.  You may receive contrast through this tube. A contrast is an injection that improves the quality of the pictures from your heart.  A gel will be applied to your chest.  A wand-like tool (transducer) will be moved over your chest. The gel will help to transmit the sound waves from the transducer.  The sound waves will harmlessly bounce off of your heart to allow the heart images to be captured in real-time motion. The images will be recorded on a computer. The procedure may vary among health care providers and hospitals. What happens after the procedure?  You may return to your normal, everyday life, including diet, activities, and medicines, unless your health care provider tells you not to do that. Summary  An echocardiogram is a procedure that uses painless sound waves (ultrasound) to produce an image of the heart.  Images from an echocardiogram can provide important information about the size and shape of your heart, heart muscle function, heart valve function, and fluid buildup around your heart.  You do not need to do anything to prepare before this procedure. You may eat and drink normally.  After the echocardiogram is completed, you may  return to your normal, everyday life, unless your health care provider tells you not to do that. This information is not intended to replace advice given to you by your health care provider. Make sure you discuss any questions you have with your health care provider. Document Revised: 04/14/2018 Document Reviewed: 01/25/2016 Elsevier Patient Education  [place].

## 2020-04-02 NOTE — Progress Notes (Signed)
Cardiology Office Note:    Date:  04/02/2020   ID:  Mitchell Miles, DOB May 12, 1945, MRN [id number]  PCP:  Helen Hashimoto., MD  Cardiologist:  Jenean Lindau, MD   Referring MD: Helen Hashimoto., MD    ASSESSMENT:    1. Atrial fibrillation, unspecified type (HCC)   2. Paroxysmal atrial fibrillation ([place])   3. Essential hypertension   4. Mixed hyperlipidemia   5. Current use of long term anticoagulation    PLAN:    In order of problems listed above:  1. Primary prevention stressed with the patient.  Importance of compliance with diet medication stressed any vocalized understanding. 2. Paroxysmal atrial fibrillation:I discussed with the patient atrial fibrillation, disease process. Management and therapy including rate and rhythm control, anticoagulation benefits and potential risks were discussed extensively with the patient. Patient had multiple questions which were answered to patient's satisfaction.  In view of the fact that the patient is on flecainide we will do a Lexiscan sestamibi. 3. Dyspnea on exertion: We will do a Lexiscan sestamibi to assess for any coronary artery disease equivalent especially in view of the fact that the patient is on antiarrhythmic medications 4. Essential hypertension: Blood pressure stable and diet was emphasized. 5. Mixed dyslipidemia: On statin therapy and tolerating well.  Lipids followed by primary care. 6. Obesity: Diet was emphasized.  Weight reduction stressed and he promises to do better. 7. Cardiac murmur: Echocardiogram will be done to assess murmur heard on auscultation.  It will also help me assess cardiac anatomy and left atrial size. 8. Patient will be seen in follow-up appointment in 6 months or earlier if the patient has any concerns    Medication Adjustments/Labs and Tests Ordered: Current medicines are reviewed at length with the patient today.  Concerns regarding medicines are outlined above.  Orders Placed This  Encounter  Procedures  . MYOCARDIAL PERFUSION IMAGING  . EKG 12-Lead  . ECHOCARDIOGRAM COMPLETE   No orders of the defined types were placed in this encounter.    History of Present Illness:    Mitchell Miles is a 75 y.o. male who is being seen today for the evaluation of paroxysmal atrial fibrillation at the request of Helen Hashimoto., MD.  Patient is a 75 year old male.  He has past medical history of essential hypertension, dyslipidemia and paroxysmal atrial fibrillation on flecainide.  He is here to be established.  He denies any chest pain orthopnea or PND.  He leads a sedentary lifestyle.  He has had history of some dementia.  At the time of my evaluation, the patient is alert awake oriented and in no distress.  Past Medical History:  Diagnosis Date  . Aphasia 11/03/2017  . Arthritis   . Atrial fibrillation ([place])   . AV block, 1st degree   . BPH (benign prostatic hypertrophy)    URINARY FREQUENCY AND NOCTURIA  . Current use of long term anticoagulation 11/04/2016  . Dementia ([place])   . Dermatitis, disseminated superficial actinic porokeratosis (DSAP)   . DSAP (disseminated superficial actinic porokeratosis)   . Dysrhythmia    PAST HX OF ATRIAL FIB / FLUTTER-CARDIAC ABLATIONS X 2 AND ON CHRONIC PRADAXA, FLECAINIDE.  CARDIOLGIST IS DR. ZAN TYSON [facility]   . Essential hypertension 11/04/2016  . Gait abnormality 05/05/2018  . GERD (gastroesophageal reflux disease)   . History of radial keratotomy 07/21/2013   Formatting of this note might be different from the original. Left Eye  . History of recent fall  11/03/2017  . Hyperlipidemia   . Hypertension   . 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 Major depression, recurrent ([place])   . Memory loss   . Mild cognitive impairment 09/03/2016  . Nuclear sclerosis, left 10/29/2013  . Nuclear sclerotic cataract 07/21/2013  . Osteoarthritis   . Plantar callosity 03/08/2015  . Problems with hearing    HAS HEARING AIDS  .  Status post cataract extraction 09/19/2013  . Thrombocytopenia ([place])     Past Surgical History:  Procedure Laterality Date  . CARDIAC ABLATIONS X2     2008 AND 2011  . CARDIAC CATHETERIZATION  1997  . CATARACT EXTRACTION, BILATERAL    . COLONOSCOPY X 3    . GREEN LIGHT LASER TURP (TRANSURETHRAL RESECTION OF PROSTATE N/A 02/26/2012   Procedure: GREEN LIGHT LASER TURP (TRANSURETHRAL RESECTION OF PROSTATE;  Surgeon: Ailene Rud, MD;  Location: [facility];  Service: Urology;  Laterality: N/A;  . HERNIA REPAIR     1948 INGUINAL HERNIA REPAIR; [place]; 9024 UMBILICAL HERNIA REPAIR  . PROSTATE BIOSPSIES X 7    . TONSILLECTOMY  1948    Current Medications: Current Meds  Medication Sig  . Ascorbic Acid (VITAMIN C) 1000 MG tablet Take 1,000 mg by mouth daily.  . carvedilol (COREG) 12.5 MG tablet Take 12.5 mg by mouth 2 (two) times daily.  . dabigatran (PRADAXA) 150 MG CAPS Take 150 mg by mouth every 12 (twelve) hours.  Marland Kitchen donepezil (ARICEPT) 10 MG tablet Take 1 tablet by mouth daily.  . DULoxetine (CYMBALTA) 60 MG capsule Take 60 mg by mouth daily.  . flecainide (TAMBOCOR) 100 MG tablet Take 100 mg by mouth 2 (two) times daily.  Marland Kitchen lisinopril (ZESTRIL) 10 MG tablet Take 10 mg by mouth daily.  . Magnesium 250 MG TABS Take 1 tablet by mouth 4 (four) times daily.  Marland Kitchen MAGNESIUM PO Take 400 mg by mouth daily.  . memantine (NAMENDA) 10 MG tablet Take 1 tablet (10 mg total) by mouth 2 (two) times daily.  . Multiple Vitamin (MULTIVITAMIN WITH MINERALS) TABS Take 1 tablet by mouth daily.  . pravastatin (PRAVACHOL) 40 MG tablet Take 40 mg by mouth every evening.     Allergies:   Other and Betadine [povidone iodine]   Social History   Socioeconomic History  . Marital status: Married    Spouse name: Not on file  . Number of children: 3  . Years of education: MD  . Highest education level: Not on file  Occupational History  . Occupation: Retired Pharmacist, community   Tobacco Use  . Smoking status: Former Smoker    Packs/day: 2.00    Years: 3.00    Pack years: 6.00    Types: Cigarettes    Quit date: 1974    Years since quitting: 48.2  . Smokeless tobacco: Never Used  . Tobacco comment: QUIT SMOKING 1974  Vaping Use  . Vaping Use: Never used  Substance and Sexual Activity  . Alcohol use: Yes    Alcohol/week: 3.0 standard drinks    Types: 1 Glasses of wine, 1 Cans of beer, 1 Standard drinks or equivalent per week  . Drug use: No  . Sexual activity: Not on file  Other Topics Concern  . Not on file  Social History Narrative   Lives at home with his wife.   Left-handed.   2 cups caffeine per day.   Social Determinants of Health   Financial Resource Strain: Not on file  Food Insecurity: Not on file  Transportation Needs: Not on file  Physical Activity: Not on file  Stress: Not on file  Social Connections: Not on file     Family History: The patient's family history includes Colon cancer in his mother; Congestive Heart Failure in his mother; Heart failure in his mother; Lung cancer in his father; Prostate cancer in his father and maternal grandfather; Stroke in his mother.  ROS:   Please see the history of present illness.    All other systems reviewed and are negative.  EKGs/Labs/Other Studies Reviewed:    The following studies were reviewed today: EKG reveals sinus rhythm and nonspecific ST-T changes   Recent Labs: No results found for requested labs within last 8760 hours.  Recent Lipid Panel No results found for: CHOL, TRIG, HDL, CHOLHDL, VLDL, LDLCALC, LDLDIRECT  Physical Exam:    VS:  BP 130/80   Pulse 92   Ht 6' (1.829 m)   Wt 252 lb 6.4 oz (114.5 kg)   SpO2 93%   BMI 34.23 kg/m     Wt Readings from Last 3 Encounters:  04/02/20 252 lb 6.4 oz (114.5 kg)  03/06/20 252 lb (114.3 kg)  11/03/17 244 lb 9.6 oz (110.9 kg)     GEN: Patient is in no acute distress HEENT: Normal NECK: No JVD; No carotid  bruits LYMPHATICS: No lymphadenopathy CARDIAC: S1 S2 regular, 2/6 systolic murmur at the apex. RESPIRATORY:  Clear to auscultation without rales, wheezing or rhonchi  ABDOMEN: Soft, non-tender, non-distended MUSCULOSKELETAL:  No edema; No deformity  SKIN: Warm and dry NEUROLOGIC:  Alert and oriented x 3 PSYCHIATRIC:  Normal affect    Signed, Jenean Lindau, MD  04/02/2020 3:26 PM    [facility]

## 2020-04-04 ENCOUNTER — Telehealth (HOSPITAL_COMMUNITY): Payer: Self-pay | Admitting: *Deleted

## 2020-04-04 NOTE — Telephone Encounter (Signed)
Patient given detailed instructions per Myocardial Perfusion Study Information Sheet for the test on 04/09/20. Patient notified to arrive 15 minutes early and that it is imperative to arrive on time for appointment to keep from having the test rescheduled.  If you need to cancel or reschedule your appointment, please call the office within 24 hours of your appointment. . Patient verbalized understanding. Kirstie Peri

## 2020-04-09 ENCOUNTER — Ambulatory Visit (INDEPENDENT_AMBULATORY_CARE_PROVIDER_SITE_OTHER): Payer: Medicare Other

## 2020-04-09 ENCOUNTER — Other Ambulatory Visit: Payer: Self-pay

## 2020-04-09 DIAGNOSIS — I4891 Unspecified atrial fibrillation: Secondary | ICD-10-CM | POA: Diagnosis not present

## 2020-04-09 LAB — MYOCARDIAL PERFUSION IMAGING
LV dias vol: 115 mL (ref 62–150)
LV sys vol: 75 mL
Peak HR: 93 {beats}/min
Rest HR: 76 {beats}/min
SDS: 0
SRS: 16
SSS: 16
TID: 0.97

## 2020-04-09 MED ORDER — TECHNETIUM TC 99M TETROFOSMIN IV KIT
11.0000 | PACK | Freq: Once | INTRAVENOUS | Status: AC | PRN
  Administered 2020-04-09: 11 via INTRAVENOUS

## 2020-04-09 MED ORDER — TECHNETIUM TC 99M TETROFOSMIN IV KIT
32.2000 | PACK | Freq: Once | INTRAVENOUS | Status: AC | PRN
  Administered 2020-04-09: 32.2 via INTRAVENOUS

## 2020-04-09 MED ORDER — REGADENOSON 0.4 MG/5ML IV SOLN
0.4000 mg | Freq: Once | INTRAVENOUS | Status: AC
  Administered 2020-04-09: 0.4 mg via INTRAVENOUS

## 2020-04-10 ENCOUNTER — Telehealth: Payer: Self-pay

## 2020-04-10 NOTE — Telephone Encounter (Signed)
Mitchell Miles is returning Mitchell Miles's call. Please advise.

## 2020-04-10 NOTE — Telephone Encounter (Signed)
Left message on patients voicemail to please return our call.   

## 2020-04-10 NOTE — Telephone Encounter (Signed)
-----   Message from Jenean Lindau, MD sent at 04/09/2020  5:26 PM EDT ----- No evidence of ischemia.  The results of the study is unremarkable. Please inform patient. I will discuss in detail at next appointment. Cc  primary care/referring physician Jenean Lindau, MD 04/09/2020 5:26 PM

## 2020-04-10 NOTE — Telephone Encounter (Signed)
Spoke with patient regarding results and recommendation.  Patient verbalizes understanding and is agreeable to plan of care. Advised patient to call back with any issues or concerns.  

## 2020-04-23 DIAGNOSIS — R3915 Urgency of urination: Secondary | ICD-10-CM | POA: Diagnosis not present

## 2020-04-25 ENCOUNTER — Other Ambulatory Visit: Payer: Self-pay

## 2020-04-25 ENCOUNTER — Ambulatory Visit (INDEPENDENT_AMBULATORY_CARE_PROVIDER_SITE_OTHER): Payer: Medicare Other

## 2020-04-25 DIAGNOSIS — I714 Abdominal aortic aneurysm, without rupture, unspecified: Secondary | ICD-10-CM

## 2020-04-25 DIAGNOSIS — I4891 Unspecified atrial fibrillation: Secondary | ICD-10-CM

## 2020-04-25 LAB — ECHOCARDIOGRAM COMPLETE
Area-P 1/2: 5.23 cm2
S' Lateral: 2.5 cm

## 2020-04-25 NOTE — Progress Notes (Signed)
Complete echocardiogram performed.  Jimmy Nyshaun Standage RDCS, RVT  

## 2020-05-09 DIAGNOSIS — G4733 Obstructive sleep apnea (adult) (pediatric): Secondary | ICD-10-CM | POA: Diagnosis not present

## 2020-05-09 DIAGNOSIS — Z7901 Long term (current) use of anticoagulants: Secondary | ICD-10-CM | POA: Diagnosis not present

## 2020-05-09 DIAGNOSIS — R269 Unspecified abnormalities of gait and mobility: Secondary | ICD-10-CM | POA: Diagnosis not present

## 2020-05-09 DIAGNOSIS — I4891 Unspecified atrial fibrillation: Secondary | ICD-10-CM | POA: Diagnosis not present

## 2020-05-09 DIAGNOSIS — I10 Essential (primary) hypertension: Secondary | ICD-10-CM | POA: Diagnosis not present

## 2020-05-09 DIAGNOSIS — R0902 Hypoxemia: Secondary | ICD-10-CM | POA: Diagnosis not present

## 2020-05-13 ENCOUNTER — Encounter: Payer: Self-pay | Admitting: Cardiology

## 2020-05-13 DIAGNOSIS — I700 Atherosclerosis of aorta: Secondary | ICD-10-CM | POA: Diagnosis not present

## 2020-05-13 DIAGNOSIS — I2510 Atherosclerotic heart disease of native coronary artery without angina pectoris: Secondary | ICD-10-CM | POA: Diagnosis not present

## 2020-05-13 DIAGNOSIS — I714 Abdominal aortic aneurysm, without rupture: Secondary | ICD-10-CM | POA: Diagnosis not present

## 2020-05-13 DIAGNOSIS — I708 Atherosclerosis of other arteries: Secondary | ICD-10-CM | POA: Diagnosis not present

## 2020-05-13 DIAGNOSIS — I712 Thoracic aortic aneurysm, without rupture: Secondary | ICD-10-CM | POA: Diagnosis not present

## 2020-05-17 ENCOUNTER — Telehealth: Payer: Self-pay

## 2020-05-17 DIAGNOSIS — E785 Hyperlipidemia, unspecified: Secondary | ICD-10-CM | POA: Diagnosis not present

## 2020-05-17 DIAGNOSIS — Z0000 Encounter for general adult medical examination without abnormal findings: Secondary | ICD-10-CM | POA: Diagnosis not present

## 2020-05-17 DIAGNOSIS — N3942 Incontinence without sensory awareness: Secondary | ICD-10-CM | POA: Diagnosis not present

## 2020-05-17 DIAGNOSIS — Z9181 History of falling: Secondary | ICD-10-CM | POA: Diagnosis not present

## 2020-05-17 DIAGNOSIS — Z1331 Encounter for screening for depression: Secondary | ICD-10-CM | POA: Diagnosis not present

## 2020-05-17 NOTE — Telephone Encounter (Signed)
sf

## 2020-05-17 NOTE — Telephone Encounter (Signed)
Results reviewed with Opal Sidles per DPR as per Dr. Julien Nordmann note.  PJane verbalized understanding and had no additional questions.  Stable aneurysm at [facility]

## 2020-05-24 DIAGNOSIS — N3942 Incontinence without sensory awareness: Secondary | ICD-10-CM | POA: Diagnosis not present

## 2020-06-12 ENCOUNTER — Ambulatory Visit (INDEPENDENT_AMBULATORY_CARE_PROVIDER_SITE_OTHER): Payer: Medicare Other | Admitting: Cardiology

## 2020-06-12 ENCOUNTER — Other Ambulatory Visit: Payer: Self-pay

## 2020-06-12 ENCOUNTER — Encounter: Payer: Self-pay | Admitting: Cardiology

## 2020-06-12 VITALS — BP 132/88 | HR 83 | Ht 72.0 in | Wt 244.2 lb

## 2020-06-12 DIAGNOSIS — I2510 Atherosclerotic heart disease of native coronary artery without angina pectoris: Secondary | ICD-10-CM | POA: Insufficient documentation

## 2020-06-12 DIAGNOSIS — R9439 Abnormal result of other cardiovascular function study: Secondary | ICD-10-CM

## 2020-06-12 DIAGNOSIS — I480 Paroxysmal atrial fibrillation: Secondary | ICD-10-CM | POA: Diagnosis not present

## 2020-06-12 DIAGNOSIS — I10 Essential (primary) hypertension: Secondary | ICD-10-CM | POA: Diagnosis not present

## 2020-06-12 DIAGNOSIS — E782 Mixed hyperlipidemia: Secondary | ICD-10-CM | POA: Diagnosis not present

## 2020-06-12 HISTORY — DX: Atherosclerotic heart disease of native coronary artery without angina pectoris: I25.10

## 2020-06-12 HISTORY — DX: Abnormal result of other cardiovascular function study: R94.39

## 2020-06-12 MED ORDER — METOPROLOL TARTRATE 100 MG PO TABS: 100.0000 mg | ORAL_TABLET | Freq: Once | ORAL | 0 refills | Status: DC

## 2020-06-12 MED ORDER — MULTAQ 400 MG PO TABS
400.0000 mg | ORAL_TABLET | Freq: Two times a day (BID) | ORAL | 12 refills | Status: DC
Start: 2020-06-12 — End: 2020-07-17

## 2020-06-12 NOTE — Progress Notes (Signed)
Cardiology Office Note:    Date:  06/12/2020   ID:  Mitchell Miles, DOB 1945/06/20, MRN [id number]  PCP:  Helen Hashimoto., MD  Cardiologist:  Jenean Lindau, MD   Referring MD: Helen Hashimoto., MD    ASSESSMENT:    1. Paroxysmal atrial fibrillation (HCC)   2. Essential hypertension   3. Mixed hyperlipidemia   4. Coronary artery disease involving native coronary artery of native heart without angina pectoris   5. Abnormal nuclear stress test    PLAN:    In order of problems listed above:  1. Coronary artery disease: Secondary prevention stressed with the patient.  Importance of compliance with diet medication stressed any vocalized understanding.  She had significant amount of coronary calcification on the CT scan. 2. Essential hypertension: Blood pressure stable and diet was emphasized. 3. Coronary artery disease: I discussed this with the patient at length.  In view of abnormal stress test we will do a CT coronary angiography and is agreeable.  Benefits and potential risks explained and he vocalized understanding. 4. Paroxysmal atrial fibrillation:I discussed with the patient atrial fibrillation, disease process. Management and therapy including rate and rhythm control, anticoagulation benefits and potential risks were discussed extensively with the patient. Patient had multiple questions which were answered to patient's satisfaction.  Patient has been on flecainide for a long time and he wants it to be changed because he has urinary incontinence and his urologist is hesitant to give him any medication since patient is on flecainide.  I will start him on Multaq and he will be back in the next few days for blood work. 5. Patient will be seen in follow-up appointment in 6 months or earlier if the patient has any concerns.  The results of echocardiogram and stress test was discussed with him at length and questions were answered to satisfaction.   Medication Adjustments/Labs and  Tests Ordered: Current medicines are reviewed at length with the patient today.  Concerns regarding medicines are outlined above.  No orders of the defined types were placed in this encounter.  No orders of the defined types were placed in this encounter.    No chief complaint on file.    History of Present Illness:    Mitchell Miles is a 75 y.o. male.  He has past medical history of paroxysmal atrial fibrillation, essential hypertension and dyslipidemia.  He denies any problems at this time and takes care of activities of daily living.  No chest pain orthopnea or PND.  He had echocardiogram and stress test.  Echocardiogram was unremarkable however stress test was abnormal and reduced ejection fraction was documented.  He had a CT scan of the chest and it showed significant atherosclerosis.  At the time of my evaluation, the patient is alert awake oriented and in no distress.  He takes care of activities of daily living without any problem.  Past Medical History:  Diagnosis Date  . Aphasia 11/03/2017  . Arthritis   . Atrial fibrillation ([place])   . AV block, 1st degree   . BPH (benign prostatic hypertrophy)    URINARY FREQUENCY AND NOCTURIA  . Current use of long term anticoagulation 11/04/2016  . Dementia ([place])   . Dermatitis, disseminated superficial actinic porokeratosis (DSAP)   . DSAP (disseminated superficial actinic porokeratosis)   . Dysrhythmia    PAST HX OF ATRIAL FIB / FLUTTER-CARDIAC ABLATIONS X 2 AND ON CHRONIC PRADAXA, FLECAINIDE.  CARDIOLGIST IS DR. ZAN TYSON [facility]   .  Essential hypertension 11/04/2016  . Gait abnormality 05/05/2018  . GERD (gastroesophageal reflux disease)   . History of radial keratotomy 07/21/2013   Formatting of this note might be different from the original. Left Eye  . History of recent fall 11/03/2017  . Hyperlipidemia   . Hypertension   . 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 Major depression, recurrent ([place])   . Memory loss    . Mild cognitive impairment 09/03/2016  . Nuclear sclerosis, left 10/29/2013  . Nuclear sclerotic cataract 07/21/2013  . Osteoarthritis   . Paroxysmal atrial fibrillation ([place]) 04/02/2020  . Plantar callosity 03/08/2015  . Problems with hearing    HAS HEARING AIDS  . Status post cataract extraction 09/19/2013  . Thrombocytopenia ([place])     Past Surgical History:  Procedure Laterality Date  . CARDIAC ABLATIONS X2     2008 AND 2011  . CARDIAC CATHETERIZATION  1997  . CATARACT EXTRACTION, BILATERAL    . COLONOSCOPY X 3    . GREEN LIGHT LASER TURP (TRANSURETHRAL RESECTION OF PROSTATE N/A 02/26/2012   Procedure: GREEN LIGHT LASER TURP (TRANSURETHRAL RESECTION OF PROSTATE;  Surgeon: Ailene Rud, MD;  Location: [facility];  Service: Urology;  Laterality: N/A;  . HERNIA REPAIR     1948 INGUINAL HERNIA REPAIR; [place]; 0981 UMBILICAL HERNIA REPAIR  . PROSTATE BIOSPSIES X 7    . TONSILLECTOMY  1948    Current Medications: No outpatient medications have been marked as taking for the 06/12/20 encounter (Office Visit) with Madylin Fairbank, Reita Cliche, MD.     Allergies:   Other and Betadine [povidone iodine]   Social History   Socioeconomic History  . Marital status: Married    Spouse name: Not on file  . Number of children: 3  . Years of education: MD  . Highest education level: Not on file  Occupational History  . Occupation: Retired Pharmacist, community  Tobacco Use  . Smoking status: Former Smoker    Packs/day: 2.00    Years: 3.00    Pack years: 6.00    Types: Cigarettes    Quit date: 1974    Years since quitting: 48.4  . Smokeless tobacco: Never Used  . Tobacco comment: QUIT SMOKING 1974  Vaping Use  . Vaping Use: Never used  Substance and Sexual Activity  . Alcohol use: Yes    Alcohol/week: 3.0 standard drinks    Types: 1 Glasses of wine, 1 Cans of beer, 1 Standard drinks or equivalent per week  . Drug use: No  . Sexual activity: Not on file  Other Topics  Concern  . Not on file  Social History Narrative   Lives at home with his wife.   Left-handed.   2 cups caffeine per day.   Social Determinants of Health   Financial Resource Strain: Not on file  Food Insecurity: Not on file  Transportation Needs: Not on file  Physical Activity: Not on file  Stress: Not on file  Social Connections: Not on file     Family History: The patient's family history includes Colon cancer in his mother; Congestive Heart Failure in his mother; Heart failure in his mother; Lung cancer in his father; Prostate cancer in his father and maternal grandfather; Stroke in his mother.  ROS:   Please see the history of present illness.    All other systems reviewed and are negative.  EKGs/Labs/Other Studies Reviewed:    The following studies were reviewed today:  IMPRESSIONS  1. Left ventricular ejection fraction, by estimation, is 50 to 55%. The  left ventricle has low normal function. The left ventricle demonstrates  regional wall motion abnormalities (see scoring diagram/findings for  description). There is mild left  ventricular hypertrophy. Left ventricular diastolic parameters are  consistent with Grade I diastolic dysfunction (impaired relaxation).  2. Right ventricular systolic function is normal. The right ventricular  size is normal. There is normal pulmonary artery systolic pressure.  3. The mitral valve is normal in structure. No evidence of mitral valve  regurgitation. No evidence of mitral stenosis.  4. The aortic valve is normal in structure. Aortic valve regurgitation is  not visualized. No aortic stenosis is present.  5. There is moderate dilatation of the ascending aorta, measuring 40 mm.  6. The inferior vena cava is normal in size with greater than 50%  respiratory variability, suggesting right atrial pressure of 3 mmHg.   Study Highlights   Nuclear stress EF: 35%.  There was no ST segment deviation noted during  stress.  No T wave inversion was noted during stress.  Defect 1: There is a small fixed defect of moderate severity present in the basal inferoseptal and basal inferior location, with global hypokinesis.  The left ventricular ejection fraction is moderately decreased (30-44%).  Findings consistent with prior myocardial infarction.  This is an intermediate risk study.      Recent Labs: No results found for requested labs within last 8760 hours.  Recent Lipid Panel No results found for: CHOL, TRIG, HDL, CHOLHDL, VLDL, LDLCALC, LDLDIRECT  Physical Exam:    VS:  BP 132/88   Pulse 83   Ht 6' (1.829 m)   Wt 244 lb 3.2 oz (110.8 kg)   SpO2 94%   BMI 33.12 kg/m     Wt Readings from Last 3 Encounters:  06/12/20 244 lb 3.2 oz (110.8 kg)  04/09/20 252 lb (114.3 kg)  04/02/20 252 lb 6.4 oz (114.5 kg)     GEN: Patient is in no acute distress HEENT: Normal NECK: No JVD; No carotid bruits LYMPHATICS: No lymphadenopathy CARDIAC: Hear sounds regular, 2/6 systolic murmur at the apex. RESPIRATORY:  Clear to auscultation without rales, wheezing or rhonchi  ABDOMEN: Soft, non-tender, non-distended MUSCULOSKELETAL:  No edema; No deformity  SKIN: Warm and dry NEUROLOGIC:  Alert and oriented x 3 PSYCHIATRIC:  Normal affect   Signed, Jenean Lindau, MD  06/12/2020 3:42 PM    [facility]

## 2020-06-12 NOTE — Patient Instructions (Addendum)
Medication Instructions:  Your physician has recommended you make the following change in your medication:   Stop Flecainide Start Multaq 400 mg twice daily  *If you need a refill on your cardiac medications before your next appointment, please call your pharmacy*   Lab Work: Your physician recommends that you return for lab work in: the next few days You need to have labs done when you are fasting.  You can come Monday through Friday 8:30 am to 12:00 pm and 1:15 to 4:30. You do not need to make an appointment as the order has already been placed. The labs you are going to have done are BMET, CBC, TSH, LFT and Lipids.   If you have labs (blood work) drawn today and your tests are completely normal, you will receive your results only by: Marland Kitchen MyChart Message (if you have MyChart) OR . A paper copy in the mail If you have any lab test that is abnormal or we need to change your treatment, we will call you to review the results.   Testing/Procedures: Your physician has requested that you have cardiac CT. Cardiac computed tomography (CT) is a painless test that uses an x-ray machine to take clear, detailed pictures of your heart. For further information please visit [email or web address]. Please follow instruction sheet as given.  Your cardiac CT will be scheduled at one of the below locations:   [facility] [address] [place], [place] [address] [phone]  At [facility], please arrive at the [facility] main entrance (entrance A) of [facility] 30 minutes prior to test start time. Proceed to the [facility] Radiology Department (first floor) to check-in and test prep.   Please follow these instructions carefully (unless otherwise directed):  Hold all erectile dysfunction medications at least 3 days (72 hrs) prior to test.  On the Night Before the Test: . Be sure to Drink plenty of water. . Do not consume any caffeinated/decaffeinated beverages or  chocolate 12 hours prior to your test. . Do not take any antihistamines 12 hours prior to your test.  On the Day of the Test: . Drink plenty of water until 1 hour prior to the test. . Do not eat any food 4 hours prior to the test. . You may take your regular medications prior to the test.  . Take metoprolol (Lopressor) two hours prior to test.  After the Test: . Drink plenty of water. . After receiving IV contrast, you may experience a mild flushed feeling. This is normal. . On occasion, you may experience a mild rash up to 24 hours after the test. This is not dangerous. If this occurs, you can take Benadryl 25 mg and increase your fluid intake. . If you experience trouble breathing, this can be serious. If it is severe call 911 IMMEDIATELY. If it is mild, please call our office. . If you take any of these medications: Glipizide/Metformin, Avandament, Glucavance, please do not take 48 hours after completing test unless otherwise instructed.   Once we have confirmed authorization from your insurance company, we will call you to set up a date and time for your test. Based on how quickly your insurance processes prior authorizations requests, please allow up to 4 weeks to be contacted for scheduling your Cardiac CT appointment. Be advised that routine Cardiac CT appointments could be scheduled as many as 8 weeks after your provider has ordered it.  For non-scheduling related questions, please contact the cardiac imaging nurse navigator  should you have any questions/concerns: Marchia Bond, Cardiac Imaging Nurse Navigator Gordy Clement, Cardiac Imaging Nurse Navigator [facility] Direct Office Dial: [phone]   For scheduling needs, including cancellations and rescheduling, please call [place], [phone].    Follow-Up: At [facility], you and your health needs are our priority.  As part of our continuing mission to provide you with exceptional heart  care, we have created designated Provider Care Teams.  These Care Teams include your primary Cardiologist (physician) and Advanced Practice Providers (APPs -  Physician Assistants and Nurse Practitioners) who all work together to provide you with the care you need, when you need it.  We recommend signing up for the patient portal called "MyChart".  Sign up information is provided on this After Visit Summary.  MyChart is used to connect with patients for Virtual Visits (Telemedicine).  Patients are able to view lab/test results, encounter notes, upcoming appointments, etc.  Non-urgent messages can be sent to your provider as well.   To learn more about what you can do with MyChart, go to [email or web address].    Your next appointment:   1 month(s)  The format for your next appointment:   In Person  Provider:   Jyl Heinz, MD   Other Instructions Dronedarone tablets What is this medicine? DRONEDARONE (droe NE da rone) is an antiarrhythmic drug. It helps make your heart beat regularly. This medicine may be used for other purposes; ask your health care provider or pharmacist if you have questions. COMMON BRAND NAME(S): Multaq What should I tell my health care provider before I take this medicine? They need to know if you have any of these conditions:  heart failure  history of irregular heartbeat  liver disease  liver or lung problems with the past use of amiodarone  low levels of magnesium in the blood  low levels of potassium in the blood  other heart disease  an unusual or allergic reaction to dronedarone, other medicines, foods, dyes, or preservatives  pregnant or trying to get pregnant  breast-feeding How should I use this medicine? Take this medicine by mouth with a glass of water. Follow the directions on the prescription label. Take one tablet with the morning meal and one tablet with the evening meal. Do not take your medicine more often than directed. Do not  stop taking except on the advice of your doctor or health care professional. A special MedGuide will be given to you by the pharmacist with each prescription and refill. Be sure to read this information carefully each time. Talk to your pediatrician regarding the use of this medicine in children. Special care may be needed. Overdosage: If you think you have taken too much of this medicine contact a poison control center or emergency room at once. NOTE: This medicine is only for you. Do not share this medicine with others. What if I miss a dose? If you miss a dose, take it as soon as you can. If it is almost time for your next dose, take only that dose. Do not take double or extra doses. What may interact with this medicine? Do not take this medicine with any of the following medications:  arsenic trioxide  certain antibiotics like clarithromycin, erythromycin, pentamidine, telithromycin, troleandomycin  certain medicines for depression like tricyclic antidepressants  certain medicines for fungal infections like fluconazole, itraconazole, ketoconazole, posaconazole, voriconazole  certain medicines for irregular heart beat like amiodarone, disopyramide, flecainide, ibutilide, quinidine, propafenone, sotalol  certain medicines for malaria  like chloroquine, halofantrine  cisapride  cyclosporine  droperidol  haloperidol  methadone  other medicines that prolong the QT interval (cause an abnormal heart rhythm) like degarelix, encorafenib, entrectinib, eribulin, goserelin, lapatinib  pimozide  nefazodone  phenothiazines like chlorpromazine, mesoridazine, prochlorperazine, thioridazine  ritonavir  ziprasidone This medicine may also interact with the following medications:  certain medicines for blood pressure, heart disease, or irregular heart beat like diltiazem, metoprolol, propranolol, verapamil  certain medicines for cholesterol like atorvastatin, lovastatin,  simvastatin  certain medicines for seizures like carbamazepine, phenobarbital, phenytoin  digoxin  dofetilide  grapefruit juice  rifampin  sirolimus  St. John's Wort  tacrolimus This list may not describe all possible interactions. Give your health care provider a list of all the medicines, herbs, non-prescription drugs, or dietary supplements you use. Also tell them if you smoke, drink alcohol, or use illegal drugs. Some items may interact with your medicine. What should I watch for while using this medicine? Your condition will be monitored closely when you first begin therapy. Often, this drug is first started in a hospital or other monitored health care setting. Once you are on maintenance therapy, visit your doctor or health care professional for regular checks on your progress. Because your condition and use of this medicine carry some risk, it is a good idea to carry an identification card, necklace or bracelet with details of your condition, medications, and doctor or health care professional. Dennis Bast may get drowsy or dizzy. Do not drive, use machinery, or do anything that needs mental alertness until you know how this medicine affects you. Do not stand or sit up quickly, especially if you are an older patient. This reduces the risk of dizzy or fainting spells. What side effects may I notice from receiving this medicine? Side effects that you should report to your doctor or health care professional as soon as possible:  allergic reactions like skin rash, itching or hives, swelling of the face, lips, or tongue  breathing problems  cough  dark urine  fast, irregular heartbeat  general ill feeling or flu-like symptoms  light-colored stools  loss of appetite, nausea  right upper belly pain  slow heartbeat  stomach pain  swelling of the legs or ankles  unusually weak or tired  weight gain  yellowing of the eyes or skin Side effects that usually do not require  medical attention (report to your doctor or health care professional if they continue or are bothersome):  nausea  vomiting This list may not describe all possible side effects. Call your doctor for medical advice about side effects. You may report side effects to FDA at 1-800-FDA-1088. Where should I keep my medicine? Keep out of the reach of children. Store at room temperature between 15 and 30 degrees C (59 and 86 degrees F). Throw away any unused medicine after the expiration date. NOTE: This sheet is a summary. It may not cover all possible information. If you have questions about this medicine, talk to your doctor, pharmacist, or health care provider.  2021 Elsevier/Gold Standard (2018-11-23 10:12:41)  Cardiac CT Angiogram A cardiac CT angiogram is a procedure to look at the heart and the area around the heart. It may be done to help find the cause of chest pains or other symptoms of heart disease. During this procedure, a substance called contrast dye is injected into the blood vessels in the area to be checked. A large X-ray machine, called a CT scanner, then takes detailed pictures of the heart  and the surrounding area. The procedure is also sometimes called a coronary CT angiogram, coronary artery scanning, or CTA. A cardiac CT angiogram allows the health care provider to see how well blood is flowing to and from the heart. The health care provider will be able to see if there are any problems, such as:  Blockage or narrowing of the coronary arteries in the heart.  Fluid around the heart.  Signs of weakness or disease in the muscles, valves, and tissues of the heart. Tell a health care provider about:  Any allergies you have. This is especially important if you have had a previous allergic reaction to contrast dye.  All medicines you are taking, including vitamins, herbs, eye drops, creams, and over-the-counter medicines.  Any blood disorders you have.  Any surgeries you have  had.  Any medical conditions you have.  Whether you are pregnant or may be pregnant.  Any anxiety disorders, chronic pain, or other conditions you have that may increase your stress or prevent you from lying still. What are the risks? Generally, this is a safe procedure. However, problems may occur, including:  Bleeding.  Infection.  Allergic reactions to medicines or dyes.  Damage to other structures or organs.  Kidney damage from the contrast dye that is used.  Increased risk of cancer from radiation exposure. This risk is low. Talk with your health care provider about: ? The risks and benefits of testing. ? How you can receive the lowest dose of radiation. What happens before the procedure?  Wear comfortable clothing and remove any jewelry, glasses, dentures, and hearing aids.  Follow instructions from your health care provider about eating and drinking. This may include: ? For 12 hours before the procedure -- avoid caffeine. This includes tea, coffee, soda, energy drinks, and diet pills. Drink plenty of water or other fluids that do not have caffeine in them. Being well hydrated can prevent complications. ? For 4-6 hours before the procedure -- stop eating and drinking. The contrast dye can cause nausea, but this is less likely if your stomach is empty.  Ask your health care provider about changing or stopping your regular medicines. This is especially important if you are taking diabetes medicines, blood thinners, or medicines to treat problems with erections (erectile dysfunction). What happens during the procedure?  Hair on your chest may need to be removed so that small sticky patches called electrodes can be placed on your chest. These will transmit information that helps to monitor your heart during the procedure.  An IV will be inserted into one of your veins.  You might be given a medicine to control your heart rate during the procedure. This will help to ensure that  good images are obtained.  You will be asked to lie on an exam table. This table will slide in and out of the CT machine during the procedure.  Contrast dye will be injected into the IV. You might feel warm, or you may get a metallic taste in your mouth.  You will be given a medicine called nitroglycerin. This will relax or dilate the arteries in your heart.  The table that you are lying on will move into the CT machine tunnel for the scan.  The person running the machine will give you instructions while the scans are being done. You may be asked to: ? Keep your arms above your head. ? Hold your breath. ? Stay very still, even if the table is moving.  When the  scanning is complete, you will be moved out of the machine.  The IV will be removed. The procedure may vary among health care providers and hospitals.   What can I expect after the procedure? After your procedure, it is common to have:  A metallic taste in your mouth from the contrast dye.  A feeling of warmth.  A headache from the nitroglycerin. Follow these instructions at home:  Take over-the-counter and prescription medicines only as told by your health care provider.  If you are told, drink enough fluid to keep your urine pale yellow. This will help to flush the contrast dye out of your body.  Most people can return to their normal activities right after the procedure. Ask your health care provider what activities are safe for you.  It is up to you to get the results of your procedure. Ask your health care provider, or the department that is doing the procedure, when your results will be ready.  Keep all follow-up visits as told by your health care provider. This is important. Contact a health care provider if:  You have any symptoms of allergy to the contrast dye. These include: ? Shortness of breath. ? Rash or hives. ? A racing heartbeat. Summary  A cardiac CT angiogram is a procedure to look at the heart  and the area around the heart. It may be done to help find the cause of chest pains or other symptoms of heart disease.  During this procedure, a large X-ray machine, called a CT scanner, takes detailed pictures of the heart and the surrounding area after a contrast dye has been injected into blood vessels in the area.  Ask your health care provider about changing or stopping your regular medicines before the procedure. This is especially important if you are taking diabetes medicines, blood thinners, or medicines to treat erectile dysfunction.  If you are told, drink enough fluid to keep your urine pale yellow. This will help to flush the contrast dye out of your body. This information is not intended to replace advice given to you by your health care provider. Make sure you discuss any questions you have with your health care provider. Document Revised: 08/17/2018 Document Reviewed: 08/17/2018 Elsevier Patient Education  2021 [employer].

## 2020-06-13 ENCOUNTER — Telehealth: Payer: Self-pay | Admitting: Cardiology

## 2020-06-13 NOTE — Telephone Encounter (Signed)
Spoke with [facility] who will speak with Dr. Chilton [place] office in reference to changing Aricept. Pt's wife advised to hold on samples until change has been made. She verbalized understanding and had no additional questions.

## 2020-06-13 NOTE — Telephone Encounter (Signed)
[facility] states that pt new rx Multaq dronedarone (MULTAQ) 016 MG tablet conflicts with  donepezil (ARICEPT) 10 MG tablet, would like to know if Dr. Geraldo Pitter is aware of this and if so to provide his approval for pt to continue taking both to complete refill, please advise.

## 2020-06-19 ENCOUNTER — Telehealth (HOSPITAL_COMMUNITY): Payer: Self-pay | Admitting: *Deleted

## 2020-06-19 ENCOUNTER — Other Ambulatory Visit (HOSPITAL_COMMUNITY): Payer: Self-pay | Admitting: *Deleted

## 2020-06-19 DIAGNOSIS — I10 Essential (primary) hypertension: Secondary | ICD-10-CM | POA: Diagnosis not present

## 2020-06-19 DIAGNOSIS — E782 Mixed hyperlipidemia: Secondary | ICD-10-CM | POA: Diagnosis not present

## 2020-06-19 DIAGNOSIS — I2510 Atherosclerotic heart disease of native coronary artery without angina pectoris: Secondary | ICD-10-CM | POA: Diagnosis not present

## 2020-06-19 DIAGNOSIS — R9439 Abnormal result of other cardiovascular function study: Secondary | ICD-10-CM | POA: Diagnosis not present

## 2020-06-19 MED ORDER — METOPROLOL TARTRATE 100 MG PO TABS: 100.0000 mg | ORAL_TABLET | Freq: Once | ORAL | 0 refills | Status: DC

## 2020-06-19 NOTE — Telephone Encounter (Signed)
Reaching out to patient to offer assistance regarding upcoming cardiac imaging study; pt's wife verbalizes understanding of appt date/time, parking situation and where to check in, pre-test NPO status and medications ordered, and verified current allergies; name and call back number provided for further questions should they arise  Gordy Clement RN Navigator Cardiac Imaging [facility] [phone] office [phone] cell  Pt to hold daily Coreg and take 100mg  metoprolol tartrate 2 hours prior to cardiac CT scan.

## 2020-06-20 LAB — CBC WITH DIFFERENTIAL/PLATELET
Basophils Absolute: 0 10*3/uL (ref 0.0–0.2)
Basos: 1 %
EOS (ABSOLUTE): 0.2 10*3/uL (ref 0.0–0.4)
Eos: 3 %
Hematocrit: 47.8 % (ref 37.5–51.0)
Hemoglobin: 16.7 g/dL (ref 13.0–17.7)
Immature Grans (Abs): 0 10*3/uL (ref 0.0–0.1)
Immature Granulocytes: 0 %
Lymphocytes Absolute: 2 10*3/uL (ref 0.7–3.1)
Lymphs: 40 %
MCH: 34.1 pg — ABNORMAL HIGH (ref 26.6–33.0)
MCHC: 34.9 g/dL (ref 31.5–35.7)
MCV: 98 fL — ABNORMAL HIGH (ref 79–97)
Monocytes Absolute: 0.4 10*3/uL (ref 0.1–0.9)
Monocytes: 9 %
Neutrophils Absolute: 2.3 10*3/uL (ref 1.4–7.0)
Neutrophils: 47 %
Platelets: 143 10*3/uL — ABNORMAL LOW (ref 150–450)
RBC: 4.9 x10E6/uL (ref 4.14–5.80)
RDW: 12.1 % (ref 11.6–15.4)
WBC: 4.9 10*3/uL (ref 3.4–10.8)

## 2020-06-20 LAB — BASIC METABOLIC PANEL
BUN/Creatinine Ratio: 15 (ref 10–24)
BUN: 15 mg/dL (ref 8–27)
CO2: 22 mmol/L (ref 20–29)
Calcium: 8.9 mg/dL (ref 8.6–10.2)
Chloride: 111 mmol/L — ABNORMAL HIGH (ref 96–106)
Creatinine, Ser: 1.01 mg/dL (ref 0.76–1.27)
Glucose: 85 mg/dL (ref 65–99)
Potassium: 3.9 mmol/L (ref 3.5–5.2)
Sodium: 146 mmol/L — ABNORMAL HIGH (ref 134–144)
eGFR: 77 mL/min/{1.73_m2} (ref >59)

## 2020-06-20 LAB — HEPATIC FUNCTION PANEL
ALT: 19 IU/L (ref 0–44)
AST: 19 IU/L (ref 0–40)
Albumin: 3.9 g/dL (ref 3.7–4.7)
Alkaline Phosphatase: 82 IU/L (ref 44–121)
Bilirubin Total: 0.8 mg/dL (ref 0.0–1.2)
Bilirubin, Direct: 0.2 mg/dL (ref 0.00–0.40)
Total Protein: 6.3 g/dL (ref 6.0–8.5)

## 2020-06-20 LAB — TSH: TSH: 1.47 u[IU]/mL (ref 0.450–4.500)

## 2020-06-20 LAB — LIPID PANEL
Chol/HDL Ratio: 4.3 ratio (ref 0.0–5.0)
Cholesterol, Total: 133 mg/dL (ref 100–199)
HDL: 31 mg/dL — ABNORMAL LOW (ref >39)
LDL Chol Calc (NIH): 77 mg/dL (ref 0–99)
Triglycerides: 140 mg/dL (ref 0–149)
VLDL Cholesterol Cal: 25 mg/dL (ref 5–40)

## 2020-06-21 ENCOUNTER — Ambulatory Visit (HOSPITAL_COMMUNITY): Admission: RE | Admit: 2020-06-21 | Payer: Medicare Other | Source: Ambulatory Visit

## 2020-06-21 ENCOUNTER — Telehealth (HOSPITAL_COMMUNITY): Payer: Self-pay | Admitting: Emergency Medicine

## 2020-06-21 DIAGNOSIS — I2510 Atherosclerotic heart disease of native coronary artery without angina pectoris: Secondary | ICD-10-CM

## 2020-06-21 MED ORDER — METOPROLOL TARTRATE 100 MG PO TABS: 100.0000 mg | ORAL_TABLET | Freq: Once | ORAL | 0 refills | Status: DC

## 2020-06-21 NOTE — Telephone Encounter (Signed)
Pts wife calling stating patient accidentally took metoprolol yesterday and therefore does not have pill for todays test. Also states he woke up with diarrhea. We decided to push back appt to next week and I will resubmit prescription for metoprolol tartrate one time dose.  Marchia Bond RN Navigator Cardiac Imaging [facility] [phone] Office  [phone] Cell

## 2020-06-24 ENCOUNTER — Telehealth: Payer: Self-pay | Admitting: Cardiology

## 2020-06-24 NOTE — Telephone Encounter (Signed)
Spoke to the patients wife just now and let her know Dr. Julien Nordmann recommendations. She verbalizes understanding and states she will call them to see.

## 2020-06-24 NOTE — Telephone Encounter (Signed)
  PT has not yet been able to take new rx dronedarone (MULTAQ) 400 MG tablet because pharmacist is stating that they conflict with PTS donepezil... please advise

## 2020-06-26 ENCOUNTER — Telehealth (HOSPITAL_COMMUNITY): Payer: Self-pay | Admitting: *Deleted

## 2020-06-26 NOTE — Telephone Encounter (Signed)
Attempted to call patient regarding upcoming cardiac CT appointment. °Left message on voicemail with name and callback number ° °Tarrell Debes RN Navigator Cardiac Imaging °[facility] °[phone] Office °[phone] Cell ° °

## 2020-06-27 ENCOUNTER — Ambulatory Visit (HOSPITAL_COMMUNITY)
Admission: RE | Admit: 2020-06-27 | Discharge: 2020-06-27 | Disposition: A | Discharge status: Home / Self Care | Payer: Medicare Other | Source: Ambulatory Visit | Attending: Cardiology | Admitting: Cardiology

## 2020-06-27 ENCOUNTER — Other Ambulatory Visit: Payer: Self-pay

## 2020-06-27 DIAGNOSIS — R9430 Abnormal result of cardiovascular function study, unspecified: Secondary | ICD-10-CM | POA: Diagnosis not present

## 2020-06-27 DIAGNOSIS — R9439 Abnormal result of other cardiovascular function study: Secondary | ICD-10-CM | POA: Insufficient documentation

## 2020-06-27 DIAGNOSIS — I2510 Atherosclerotic heart disease of native coronary artery without angina pectoris: Secondary | ICD-10-CM | POA: Diagnosis not present

## 2020-06-27 MED ORDER — IOHEXOL 350 MG/ML SOLN
100.0000 mL | Freq: Once | INTRAVENOUS | Status: AC | PRN
  Administered 2020-06-27: 100 mL via INTRAVENOUS

## 2020-06-27 MED ORDER — NITROGLYCERIN 0.4 MG SL SUBL
0.8000 mg | SUBLINGUAL_TABLET | Freq: Once | SUBLINGUAL | Status: AC
  Administered 2020-06-27: 0.8 mg via SUBLINGUAL

## 2020-06-27 MED ORDER — NITROGLYCERIN 0.4 MG SL SUBL
SUBLINGUAL_TABLET | SUBLINGUAL | Status: AC
  Filled 2020-06-27: qty 2

## 2020-07-01 ENCOUNTER — Telehealth: Payer: Self-pay | Admitting: Cardiology

## 2020-07-01 NOTE — Telephone Encounter (Signed)
Patient's wife called to say that he will stop talking Ascorbic Acid (VITAMIN C) 1000 MG tablet the so that he can take the dronedarone (MULTAQ) 400 MG tablet. His neurologist in [place] told them to do this. Please advise

## 2020-07-01 NOTE — Telephone Encounter (Signed)
Called patient wife per dpr informed her patient is good to follow the recommendation with vitamin  c and start multaq. She understood no further questions.

## 2020-07-02 DIAGNOSIS — N401 Enlarged prostate with lower urinary tract symptoms: Secondary | ICD-10-CM | POA: Diagnosis not present

## 2020-07-02 DIAGNOSIS — N3942 Incontinence without sensory awareness: Secondary | ICD-10-CM | POA: Diagnosis not present

## 2020-07-04 NOTE — Telephone Encounter (Signed)
Follow up:     Ann from [facility] clinic concerning the status of this message please advise.

## 2020-07-04 NOTE — Telephone Encounter (Signed)
Spoke to [place] with neurologist clinic. She reports that the question was supposed to be about the patient's aricept. There is apparently a interaction between aricept and multaq not vitamin c. I advised her to check with pharmacist and let us know if she has further questions after than no further questions.

## 2020-07-09 DIAGNOSIS — I440 Atrioventricular block, first degree: Secondary | ICD-10-CM | POA: Diagnosis not present

## 2020-07-09 DIAGNOSIS — E785 Hyperlipidemia, unspecified: Secondary | ICD-10-CM | POA: Diagnosis not present

## 2020-07-09 DIAGNOSIS — F0390 Unspecified dementia without behavioral disturbance: Secondary | ICD-10-CM | POA: Diagnosis not present

## 2020-07-09 DIAGNOSIS — R32 Unspecified urinary incontinence: Secondary | ICD-10-CM | POA: Diagnosis not present

## 2020-07-09 DIAGNOSIS — I10 Essential (primary) hypertension: Secondary | ICD-10-CM | POA: Diagnosis not present

## 2020-07-09 DIAGNOSIS — Z6834 Body mass index (BMI) 34.0-34.9, adult: Secondary | ICD-10-CM | POA: Diagnosis not present

## 2020-07-09 DIAGNOSIS — I4891 Unspecified atrial fibrillation: Secondary | ICD-10-CM | POA: Diagnosis not present

## 2020-07-09 DIAGNOSIS — Z79899 Other long term (current) drug therapy: Secondary | ICD-10-CM | POA: Diagnosis not present

## 2020-07-09 DIAGNOSIS — K219 Gastro-esophageal reflux disease without esophagitis: Secondary | ICD-10-CM | POA: Diagnosis not present

## 2020-07-17 ENCOUNTER — Telehealth: Payer: Self-pay | Admitting: Cardiology

## 2020-07-17 DIAGNOSIS — I10 Essential (primary) hypertension: Secondary | ICD-10-CM

## 2020-07-17 DIAGNOSIS — R9439 Abnormal result of other cardiovascular function study: Secondary | ICD-10-CM

## 2020-07-17 DIAGNOSIS — I2510 Atherosclerotic heart disease of native coronary artery without angina pectoris: Secondary | ICD-10-CM

## 2020-07-17 DIAGNOSIS — I480 Paroxysmal atrial fibrillation: Secondary | ICD-10-CM

## 2020-07-17 MED ORDER — MULTAQ 400 MG PO TABS
400.0000 mg | ORAL_TABLET | Freq: Two times a day (BID) | ORAL | 3 refills | Status: AC
Start: 2020-07-17 — End: ?

## 2020-07-17 NOTE — Telephone Encounter (Signed)
*  STAT* If patient is at the pharmacy, call can be transferred to refill team.   1. Which medications need to be refilled? (please list name of each medication and dose if known) multaq 400 mg BID  2. Which pharmacy/location (including street and city if local pharmacy) is medication to be sent to? [facility]  3. Do they need a 30 day or 90 day supply? 90 if he can   Patient will be out by the end of week

## 2020-07-19 ENCOUNTER — Other Ambulatory Visit: Payer: Self-pay

## 2020-07-22 ENCOUNTER — Ambulatory Visit: Payer: Medicare Other | Admitting: Cardiology

## 2020-08-05 ENCOUNTER — Telehealth: Payer: Self-pay

## 2020-08-05 NOTE — Telephone Encounter (Signed)
Patient sent a letter stating that Aricept was helping him and wants to know if it is ok to restart this medication.

## 2020-08-06 NOTE — Telephone Encounter (Signed)
Advised patient to contact PCP on continuing the Aricept.

## 2020-08-21 DIAGNOSIS — N3942 Incontinence without sensory awareness: Secondary | ICD-10-CM | POA: Diagnosis not present

## 2020-08-21 DIAGNOSIS — N3020 Other chronic cystitis without hematuria: Secondary | ICD-10-CM | POA: Diagnosis not present

## 2020-09-22 DIAGNOSIS — Z23 Encounter for immunization: Secondary | ICD-10-CM | POA: Diagnosis not present

## 2020-10-25 DIAGNOSIS — G4752 REM sleep behavior disorder: Secondary | ICD-10-CM | POA: Diagnosis not present

## 2020-10-25 DIAGNOSIS — N319 Neuromuscular dysfunction of bladder, unspecified: Secondary | ICD-10-CM | POA: Diagnosis not present

## 2020-10-25 DIAGNOSIS — G4700 Insomnia, unspecified: Secondary | ICD-10-CM | POA: Diagnosis not present

## 2020-10-25 DIAGNOSIS — Z6833 Body mass index (BMI) 33.0-33.9, adult: Secondary | ICD-10-CM | POA: Diagnosis not present

## 2020-10-25 DIAGNOSIS — G2581 Restless legs syndrome: Secondary | ICD-10-CM | POA: Diagnosis not present

## 2020-10-25 DIAGNOSIS — G3184 Mild cognitive impairment, so stated: Secondary | ICD-10-CM | POA: Diagnosis not present

## 2020-10-25 DIAGNOSIS — F3341 Major depressive disorder, recurrent, in partial remission: Secondary | ICD-10-CM | POA: Diagnosis not present

## 2020-11-06 DIAGNOSIS — I4891 Unspecified atrial fibrillation: Secondary | ICD-10-CM | POA: Diagnosis not present

## 2020-11-06 DIAGNOSIS — F0390 Unspecified dementia without behavioral disturbance: Secondary | ICD-10-CM | POA: Diagnosis not present

## 2020-11-06 DIAGNOSIS — I440 Atrioventricular block, first degree: Secondary | ICD-10-CM | POA: Diagnosis not present

## 2020-11-06 DIAGNOSIS — R32 Unspecified urinary incontinence: Secondary | ICD-10-CM | POA: Diagnosis not present

## 2020-11-06 DIAGNOSIS — E785 Hyperlipidemia, unspecified: Secondary | ICD-10-CM | POA: Diagnosis not present

## 2020-11-06 DIAGNOSIS — K219 Gastro-esophageal reflux disease without esophagitis: Secondary | ICD-10-CM | POA: Diagnosis not present

## 2020-11-06 DIAGNOSIS — I10 Essential (primary) hypertension: Secondary | ICD-10-CM | POA: Diagnosis not present

## 2020-11-06 DIAGNOSIS — Z6834 Body mass index (BMI) 34.0-34.9, adult: Secondary | ICD-10-CM | POA: Diagnosis not present

## 2020-11-06 DIAGNOSIS — Z79899 Other long term (current) drug therapy: Secondary | ICD-10-CM | POA: Diagnosis not present

## 2020-11-11 DIAGNOSIS — J151 Pneumonia due to Pseudomonas: Secondary | ICD-10-CM | POA: Diagnosis not present

## 2020-11-11 DIAGNOSIS — E119 Type 2 diabetes mellitus without complications: Secondary | ICD-10-CM | POA: Diagnosis present

## 2020-11-11 DIAGNOSIS — R579 Shock, unspecified: Secondary | ICD-10-CM | POA: Diagnosis not present

## 2020-11-11 DIAGNOSIS — R0902 Hypoxemia: Secondary | ICD-10-CM | POA: Diagnosis not present

## 2020-11-11 DIAGNOSIS — I499 Cardiac arrhythmia, unspecified: Secondary | ICD-10-CM | POA: Diagnosis not present

## 2020-11-11 DIAGNOSIS — Z515 Encounter for palliative care: Secondary | ICD-10-CM | POA: Diagnosis not present

## 2020-11-11 DIAGNOSIS — I4819 Other persistent atrial fibrillation: Secondary | ICD-10-CM | POA: Diagnosis not present

## 2020-11-11 DIAGNOSIS — F32A Depression, unspecified: Secondary | ICD-10-CM | POA: Diagnosis not present

## 2020-11-11 DIAGNOSIS — R519 Headache, unspecified: Secondary | ICD-10-CM | POA: Diagnosis not present

## 2020-11-11 DIAGNOSIS — J302 Other seasonal allergic rhinitis: Secondary | ICD-10-CM | POA: Diagnosis present

## 2020-11-11 DIAGNOSIS — G931 Anoxic brain damage, not elsewhere classified: Secondary | ICD-10-CM | POA: Diagnosis not present

## 2020-11-11 DIAGNOSIS — R785 Finding of other psychotropic drug in blood: Secondary | ICD-10-CM | POA: Diagnosis not present

## 2020-11-11 DIAGNOSIS — Z7901 Long term (current) use of anticoagulants: Secondary | ICD-10-CM | POA: Diagnosis not present

## 2020-11-11 DIAGNOSIS — G9340 Encephalopathy, unspecified: Secondary | ICD-10-CM | POA: Diagnosis not present

## 2020-11-11 DIAGNOSIS — Z8674 Personal history of sudden cardiac arrest: Secondary | ICD-10-CM | POA: Diagnosis not present

## 2020-11-11 DIAGNOSIS — J150 Pneumonia due to Klebsiella pneumoniae: Secondary | ICD-10-CM | POA: Diagnosis not present

## 2020-11-11 DIAGNOSIS — J95851 Ventilator associated pneumonia: Secondary | ICD-10-CM | POA: Diagnosis not present

## 2020-11-11 DIAGNOSIS — J9600 Acute respiratory failure, unspecified whether with hypoxia or hypercapnia: Secondary | ICD-10-CM | POA: Diagnosis not present

## 2020-11-11 DIAGNOSIS — I480 Paroxysmal atrial fibrillation: Secondary | ICD-10-CM | POA: Diagnosis present

## 2020-11-11 DIAGNOSIS — Z4659 Encounter for fitting and adjustment of other gastrointestinal appliance and device: Secondary | ICD-10-CM | POA: Diagnosis not present

## 2020-11-11 DIAGNOSIS — I493 Ventricular premature depolarization: Secondary | ICD-10-CM | POA: Diagnosis not present

## 2020-11-11 DIAGNOSIS — I639 Cerebral infarction, unspecified: Secondary | ICD-10-CM | POA: Diagnosis not present

## 2020-11-11 DIAGNOSIS — R1084 Generalized abdominal pain: Secondary | ICD-10-CM | POA: Diagnosis not present

## 2020-11-11 DIAGNOSIS — I10 Essential (primary) hypertension: Secondary | ICD-10-CM | POA: Diagnosis present

## 2020-11-11 DIAGNOSIS — J156 Pneumonia due to other aerobic Gram-negative bacteria: Secondary | ICD-10-CM | POA: Diagnosis not present

## 2020-11-11 DIAGNOSIS — R918 Other nonspecific abnormal finding of lung field: Secondary | ICD-10-CM | POA: Diagnosis not present

## 2020-11-11 DIAGNOSIS — I959 Hypotension, unspecified: Secondary | ICD-10-CM | POA: Diagnosis not present

## 2020-11-11 DIAGNOSIS — R404 Transient alteration of awareness: Secondary | ICD-10-CM | POA: Diagnosis not present

## 2020-11-11 DIAGNOSIS — J189 Pneumonia, unspecified organism: Secondary | ICD-10-CM | POA: Diagnosis not present

## 2020-11-11 DIAGNOSIS — R5381 Other malaise: Secondary | ICD-10-CM | POA: Diagnosis not present

## 2020-11-11 DIAGNOSIS — E785 Hyperlipidemia, unspecified: Secondary | ICD-10-CM | POA: Diagnosis present

## 2020-11-11 DIAGNOSIS — N179 Acute kidney failure, unspecified: Secondary | ICD-10-CM | POA: Diagnosis not present

## 2020-11-11 DIAGNOSIS — J80 Acute respiratory distress syndrome: Secondary | ICD-10-CM | POA: Diagnosis not present

## 2020-11-11 DIAGNOSIS — J9811 Atelectasis: Secondary | ICD-10-CM | POA: Diagnosis not present

## 2020-11-11 DIAGNOSIS — I63541 Cerebral infarction due to unspecified occlusion or stenosis of right cerebellar artery: Secondary | ICD-10-CM | POA: Diagnosis not present

## 2020-11-11 DIAGNOSIS — Z66 Do not resuscitate: Secondary | ICD-10-CM | POA: Diagnosis not present

## 2020-11-11 DIAGNOSIS — H05221 Edema of right orbit: Secondary | ICD-10-CM | POA: Diagnosis not present

## 2020-11-11 DIAGNOSIS — I4891 Unspecified atrial fibrillation: Secondary | ICD-10-CM | POA: Diagnosis not present

## 2020-11-11 DIAGNOSIS — J3081 Allergic rhinitis due to animal (cat) (dog) hair and dander: Secondary | ICD-10-CM | POA: Diagnosis present

## 2020-11-11 DIAGNOSIS — R0602 Shortness of breath: Secondary | ICD-10-CM | POA: Diagnosis not present

## 2020-11-11 DIAGNOSIS — Z79899 Other long term (current) drug therapy: Secondary | ICD-10-CM | POA: Diagnosis not present

## 2020-11-11 DIAGNOSIS — G3184 Mild cognitive impairment, so stated: Secondary | ICD-10-CM | POA: Diagnosis not present

## 2020-11-11 DIAGNOSIS — N170 Acute kidney failure with tubular necrosis: Secondary | ICD-10-CM | POA: Diagnosis present

## 2020-11-11 DIAGNOSIS — Z4682 Encounter for fitting and adjustment of non-vascular catheter: Secondary | ICD-10-CM | POA: Diagnosis not present

## 2020-11-11 DIAGNOSIS — I469 Cardiac arrest, cause unspecified: Secondary | ICD-10-CM | POA: Diagnosis not present

## 2020-11-11 DIAGNOSIS — J9601 Acute respiratory failure with hypoxia: Secondary | ICD-10-CM | POA: Diagnosis present

## 2020-11-11 DIAGNOSIS — T782XXA Anaphylactic shock, unspecified, initial encounter: Secondary | ICD-10-CM | POA: Diagnosis not present

## 2020-12-05 DEATH — deceased

## 2021-04-21 ENCOUNTER — Other Ambulatory Visit: Payer: Self-pay | Admitting: Cardiology

## 2021-04-21 DIAGNOSIS — I714 Abdominal aortic aneurysm, without rupture, unspecified: Secondary | ICD-10-CM

## 2021-07-21 NOTE — Addendum Note (Signed)
Encounter addended by: Annie Paras on: 07/21/2021 1:59 PM  Actions taken: Letter saved
